# Patient Record
Sex: Female | Born: 1975
Health system: Southern US, Community
[De-identification: ages and names within clinical notes are randomized; demographics above are authoritative.]

## PROBLEM LIST (undated history)

## (undated) DIAGNOSIS — M1990 Unspecified osteoarthritis, unspecified site: Secondary | ICD-10-CM

## (undated) DIAGNOSIS — F319 Bipolar disorder, unspecified: Secondary | ICD-10-CM

## (undated) DIAGNOSIS — IMO0002 Reserved for concepts with insufficient information to code with codable children: Secondary | ICD-10-CM

## (undated) DIAGNOSIS — F32A Depression, unspecified: Secondary | ICD-10-CM

## (undated) DIAGNOSIS — R51 Headache: Secondary | ICD-10-CM

## (undated) DIAGNOSIS — R519 Headache, unspecified: Secondary | ICD-10-CM

## (undated) DIAGNOSIS — F329 Major depressive disorder, single episode, unspecified: Secondary | ICD-10-CM

## (undated) DIAGNOSIS — J45998 Other asthma: Secondary | ICD-10-CM

## (undated) DIAGNOSIS — F419 Anxiety disorder, unspecified: Secondary | ICD-10-CM

## (undated) DIAGNOSIS — F988 Other specified behavioral and emotional disorders with onset usually occurring in childhood and adolescence: Secondary | ICD-10-CM

## (undated) DIAGNOSIS — I499 Cardiac arrhythmia, unspecified: Secondary | ICD-10-CM

## (undated) DIAGNOSIS — M797 Fibromyalgia: Secondary | ICD-10-CM

## (undated) HISTORY — DX: Depression, unspecified: F32.A

## (undated) HISTORY — PX: CARPAL TUNNEL RELEASE: SHX101

## (undated) HISTORY — DX: Other specified behavioral and emotional disorders with onset usually occurring in childhood and adolescence: F98.8

## (undated) HISTORY — DX: Major depressive disorder, single episode, unspecified: F32.9

## (undated) HISTORY — PX: ABLATION: SHX5711

## (undated) HISTORY — PX: WISDOM TOOTH EXTRACTION: SHX21

## (undated) HISTORY — PX: HYSTEROSCOPY W/ ENDOMETRIAL ABLATION: SUR665

## (undated) HISTORY — DX: Anxiety disorder, unspecified: F41.9

## (undated) HISTORY — PX: CHOLECYSTECTOMY: SHX55

## (undated) HISTORY — PX: TUBAL LIGATION: SHX77

## (undated) HISTORY — DX: Bipolar disorder, unspecified: F31.9

---

## 1998-05-22 ENCOUNTER — Emergency Department (HOSPITAL_COMMUNITY): Admission: EM | Admit: 1998-05-22 | Discharge: 1998-05-22 | Payer: Self-pay | Admitting: Emergency Medicine

## 1998-08-26 ENCOUNTER — Emergency Department (HOSPITAL_COMMUNITY): Admission: EM | Admit: 1998-08-26 | Discharge: 1998-08-26 | Payer: Self-pay | Admitting: Emergency Medicine

## 1999-02-11 ENCOUNTER — Other Ambulatory Visit: Admission: RE | Admit: 1999-02-11 | Discharge: 1999-02-11 | Payer: Self-pay | Admitting: Obstetrics and Gynecology

## 1999-04-30 ENCOUNTER — Ambulatory Visit (HOSPITAL_COMMUNITY): Admission: RE | Admit: 1999-04-30 | Discharge: 1999-04-30 | Payer: Self-pay | Admitting: Obstetrics and Gynecology

## 1999-04-30 ENCOUNTER — Encounter: Payer: Self-pay | Admitting: Obstetrics and Gynecology

## 1999-09-24 ENCOUNTER — Encounter (HOSPITAL_COMMUNITY): Admission: RE | Admit: 1999-09-24 | Discharge: 1999-09-27 | Payer: Self-pay | Admitting: Obstetrics and Gynecology

## 1999-09-27 ENCOUNTER — Inpatient Hospital Stay (HOSPITAL_COMMUNITY): Admission: AD | Admit: 1999-09-27 | Discharge: 1999-09-29 | Payer: Self-pay | Admitting: Obstetrics and Gynecology

## 1999-09-27 ENCOUNTER — Encounter (INDEPENDENT_AMBULATORY_CARE_PROVIDER_SITE_OTHER): Payer: Self-pay | Admitting: Specialist

## 1999-10-03 ENCOUNTER — Encounter: Admission: RE | Admit: 1999-10-03 | Discharge: 2000-01-01 | Payer: Self-pay | Admitting: Obstetrics and Gynecology

## 1999-10-13 ENCOUNTER — Emergency Department (HOSPITAL_COMMUNITY): Admission: EM | Admit: 1999-10-13 | Discharge: 1999-10-13 | Payer: Self-pay | Admitting: Emergency Medicine

## 2000-01-02 ENCOUNTER — Encounter: Admission: RE | Admit: 2000-01-02 | Discharge: 2000-04-01 | Payer: Self-pay | Admitting: Obstetrics and Gynecology

## 2000-05-02 ENCOUNTER — Encounter: Admission: RE | Admit: 2000-05-02 | Discharge: 2000-06-01 | Payer: Self-pay | Admitting: Obstetrics and Gynecology

## 2000-06-10 ENCOUNTER — Other Ambulatory Visit: Admission: RE | Admit: 2000-06-10 | Discharge: 2000-06-10 | Payer: Self-pay | Admitting: Obstetrics and Gynecology

## 2000-06-26 ENCOUNTER — Ambulatory Visit (HOSPITAL_COMMUNITY): Admission: RE | Admit: 2000-06-26 | Discharge: 2000-06-26 | Payer: Self-pay | Admitting: Obstetrics and Gynecology

## 2000-07-02 ENCOUNTER — Encounter: Admission: RE | Admit: 2000-07-02 | Discharge: 2000-08-01 | Payer: Self-pay | Admitting: Obstetrics and Gynecology

## 2000-08-31 ENCOUNTER — Other Ambulatory Visit: Admission: RE | Admit: 2000-08-31 | Discharge: 2000-08-31 | Payer: Self-pay | Admitting: Dermatology

## 2000-09-02 ENCOUNTER — Emergency Department (HOSPITAL_COMMUNITY): Admission: EM | Admit: 2000-09-02 | Discharge: 2000-09-02 | Payer: Self-pay | Admitting: Emergency Medicine

## 2000-09-02 ENCOUNTER — Encounter: Payer: Self-pay | Admitting: Emergency Medicine

## 2001-06-11 ENCOUNTER — Other Ambulatory Visit: Admission: RE | Admit: 2001-06-11 | Discharge: 2001-06-11 | Payer: Self-pay | Admitting: Obstetrics and Gynecology

## 2001-06-21 ENCOUNTER — Encounter: Payer: Self-pay | Admitting: Obstetrics and Gynecology

## 2001-06-21 ENCOUNTER — Ambulatory Visit (HOSPITAL_COMMUNITY): Admission: RE | Admit: 2001-06-21 | Discharge: 2001-06-21 | Payer: Self-pay | Admitting: Obstetrics and Gynecology

## 2002-05-13 ENCOUNTER — Emergency Department (HOSPITAL_COMMUNITY): Admission: EM | Admit: 2002-05-13 | Discharge: 2002-05-14 | Payer: Self-pay | Admitting: Emergency Medicine

## 2002-05-15 ENCOUNTER — Encounter: Payer: Self-pay | Admitting: Emergency Medicine

## 2002-05-15 ENCOUNTER — Emergency Department (HOSPITAL_COMMUNITY): Admission: EM | Admit: 2002-05-15 | Discharge: 2002-05-15 | Payer: Self-pay | Admitting: Emergency Medicine

## 2002-07-25 ENCOUNTER — Ambulatory Visit (HOSPITAL_COMMUNITY): Admission: RE | Admit: 2002-07-25 | Discharge: 2002-07-25 | Payer: Self-pay | Admitting: Family Medicine

## 2002-07-25 ENCOUNTER — Encounter: Payer: Self-pay | Admitting: Family Medicine

## 2003-01-16 ENCOUNTER — Ambulatory Visit (HOSPITAL_COMMUNITY): Admission: RE | Admit: 2003-01-16 | Discharge: 2003-01-16 | Payer: Self-pay | Admitting: Family Medicine

## 2003-07-07 ENCOUNTER — Ambulatory Visit (HOSPITAL_COMMUNITY): Admission: RE | Admit: 2003-07-07 | Discharge: 2003-07-07 | Payer: Self-pay | Admitting: Internal Medicine

## 2003-08-08 ENCOUNTER — Emergency Department (HOSPITAL_COMMUNITY): Admission: EM | Admit: 2003-08-08 | Discharge: 2003-08-08 | Payer: Self-pay | Admitting: *Deleted

## 2003-09-06 ENCOUNTER — Ambulatory Visit (HOSPITAL_COMMUNITY): Admission: RE | Admit: 2003-09-06 | Discharge: 2003-09-06 | Payer: Self-pay | Admitting: Family Medicine

## 2004-01-10 ENCOUNTER — Emergency Department (HOSPITAL_COMMUNITY): Admission: EM | Admit: 2004-01-10 | Discharge: 2004-01-11 | Payer: Self-pay | Admitting: Emergency Medicine

## 2004-03-12 ENCOUNTER — Ambulatory Visit (HOSPITAL_COMMUNITY): Admission: RE | Admit: 2004-03-12 | Discharge: 2004-03-12 | Payer: Self-pay | Admitting: Neurosurgery

## 2005-10-31 ENCOUNTER — Other Ambulatory Visit: Admission: RE | Admit: 2005-10-31 | Discharge: 2005-10-31 | Payer: Self-pay | Admitting: Family Medicine

## 2006-02-19 ENCOUNTER — Encounter: Admission: RE | Admit: 2006-02-19 | Discharge: 2006-02-19 | Payer: Self-pay | Admitting: Family Medicine

## 2006-03-07 ENCOUNTER — Emergency Department (HOSPITAL_COMMUNITY): Admission: EM | Admit: 2006-03-07 | Discharge: 2006-03-08 | Payer: Self-pay | Admitting: Emergency Medicine

## 2006-06-28 ENCOUNTER — Ambulatory Visit (HOSPITAL_COMMUNITY): Admission: RE | Admit: 2006-06-28 | Discharge: 2006-06-28 | Payer: Self-pay | Admitting: Family Medicine

## 2006-08-28 ENCOUNTER — Ambulatory Visit (HOSPITAL_COMMUNITY): Admission: RE | Admit: 2006-08-28 | Discharge: 2006-08-28 | Payer: Self-pay | Admitting: Obstetrics and Gynecology

## 2007-02-18 ENCOUNTER — Ambulatory Visit (HOSPITAL_COMMUNITY): Admission: RE | Admit: 2007-02-18 | Discharge: 2007-02-18 | Payer: Self-pay | Admitting: Family Medicine

## 2007-11-24 ENCOUNTER — Ambulatory Visit (HOSPITAL_COMMUNITY): Admission: RE | Admit: 2007-11-24 | Discharge: 2007-11-24 | Payer: Self-pay | Admitting: Otolaryngology

## 2008-08-03 ENCOUNTER — Ambulatory Visit: Payer: Self-pay | Admitting: Diagnostic Radiology

## 2008-08-03 ENCOUNTER — Emergency Department (HOSPITAL_BASED_OUTPATIENT_CLINIC_OR_DEPARTMENT_OTHER): Admission: EM | Admit: 2008-08-03 | Discharge: 2008-08-04 | Payer: Self-pay | Admitting: Emergency Medicine

## 2009-03-29 ENCOUNTER — Encounter: Admission: RE | Admit: 2009-03-29 | Discharge: 2009-03-29 | Payer: Self-pay | Admitting: Family Medicine

## 2009-05-30 ENCOUNTER — Ambulatory Visit (HOSPITAL_BASED_OUTPATIENT_CLINIC_OR_DEPARTMENT_OTHER): Admission: RE | Admit: 2009-05-30 | Discharge: 2009-05-30 | Payer: Self-pay | Admitting: Family Medicine

## 2009-05-30 ENCOUNTER — Ambulatory Visit: Payer: Self-pay | Admitting: Diagnostic Radiology

## 2010-05-24 NOTE — Op Note (Signed)
[facility]  Patient:    Kayla Jacobson, Kayla Jacobson                    MRN: [id number] Proc. Date: 06/26/00 Attending:  Zenaida Niece, M.D.                           Operative Report  PREOPERATIVE DIAGNOSIS:  Desires surgical sterility.  POSTOPERATIVE DIAGNOSIS:  Desires surgical sterility.  PROCEDURE:  Laparoscopic bilateral tubal fulguration.  SURGEON:  Zenaida Niece, M.D.  ANESTHESIA:  General endotracheal tube.  ESTIMATED BLOOD LOSS:  Less than 50 cc.  FINDINGS:  Normal female anatomy.  PROCEDURE IN DETAIL:  The patient was taken to the operating room and placed in the dorsal supine position.  General anesthesia was induced, and she was placed in mobile stirrups.  Her abdomen was then prepped and draped in the usual sterile fashion, her bladder drained with a red rubber catheter, and a Hulka tenaculum applied to her cervix for uterine manipulation.  Her infraumbilical skin was then infiltrated with 0.25% Marcaine, and 1.5 cm horizontal incision was made.  The Veress needle was inserted into the peritoneal cavity and placement confirmed by the water drop test and an opening pressure of 3 mmHg.  Four liters of CO2 gas were insufflated, and the Veress needle was removed.  The 10-11 disposable trocar was then introduced and placement confirmed by the laparoscope.  Both fallopian tubes were identified and traced to their fimbriated ends.  The middle portion of each tube was desiccated with bipolar cautery in three separate segments to achieve tubal fulguration.  This was done on each side until the amp meter read 0 with adequate desiccation.  All sites were hemostatic, and the anatomy was otherwise normal.  All gas was allowed to deflate from the abdomen, and the trocar was removed.  The skin was closed with interrupted subcuticular sutures of 4-0 Vicryl followed by Steri-Strips and band-aids.  The Hulka tenaculum was removed.  Counts were correct.   The patient was extubated in the operating room and tolerated the procedure well and was taken to the recovery room in stable condition. DD:  06/26/00 TD:  06/26/00 Job: [id number]

## 2010-05-24 NOTE — Discharge Summary (Signed)
[facility]  Patient:    Kayla Jacobson, Kayla Jacobson                    MRN: [id number] Proc. Date: 09/29/99 Adm. Date:  [id number] Disc. Date: [id number] Attending:  Michaele Offer                           Discharge Summary  ADMISSION DIAGNOSIS:          Intrauterine pregnancy at 40 weeks.  DISCHARGE DIAGNOSES:          1. Intrauterine pregnancy at 40 weeks,                                  delivered.                               2. Chorioamnionitis.                               3. Obesity.  PROCEDURE:                    Spontaneous vaginal delivery.  COMPLICATIONS:                None.  CONSULTATIONS:                None.  HISTORY AND PHYSICAL:         This is a 35 year old white female gravida 2, para 1-0-0-1 with an EGA of 40+ weeks by first trimester ultrasound with an [facility] of September 24, 1999, who presents with a complaint of rupture of membranes at 12:45 on the day of admission with some irregular contractions, no bleeding and with good fetal movement. Evaluation in the office confirmed spontaneous rupture of membranes and her vaginal examination was 1 to 2, 80, -3, with a vertex presentation.  Prenatal care complicated by an upper respiratory tract infection at 37 weeks treated with Zithromax.  PRENATAL LABS:                Blood type is O positive with a negative antibody screen.  RPR nonreactive.  Rubella immune.  Hepatitis B surface antigen negative.  HIV declined. Triple screen normal.  Gonorrhea and chlamydia negative.  Glucola was 141. A three hour GTT was normal.  Group B strep is negative.  PAST OB HISTORY:              In 1995, vaginal delivery at 40 weeks of 6 pound 7 ounce infant complicated by meconium and postpartum depression treated with Prozac.  GYN HISTORY:                  Significant for an abnormal Pap smear with a normal biopsy and normal follow-up.  PAST MEDICAL HISTORY:         Obesity, depression, history  of PVCs and degenerative disc.  PAST SURGICAL HISTORY:        Wisdom tooth removal.  ALLERGIES:                    PENICILLIN.  SOCIAL HISTORY:               The patient is married and quit smoking with pregnancy.  PHYSICAL EXAMINATION:  VITAL  SIGNS:                  Weight 304 pounds.  She was afebrile.  Blood pressure 130 to 15/70 to 90.  Fetal heart tracing was reactive with contractions every two to three minutes.  ABDOMEN:                      Obese, gravid and nontender with an estimated fetal weight of 8 pounds.  VAGINAL:                      As above in the office and by the nurse on labor and delivery, she was 2, 80 and -3 with possible light meconium stained fluid.  HOSPITAL COURSE:              The patient was admitted and continued to contract on her own.  She had some mild late elevated blood pressures and ______ were drawn and came back within normal limits.  She received an epidural and this brought her blood pressures down and she actually required ephedrine to maintain and adequate blood pressure.  She progressed to 6 cm at this point and the nurse placed an FSE.  She then developed a temperature to 100.6 when she was approximately 8 to 9 cm and received one dose of Cefotan. She then progressed fairly quickly to complete and pushed well.  On the evening of September 27, 1999, she had a vaginal delivery of a viable female infant with Apgars of 8 and 9 that weighed 8 pounds 8 ounces over a first degree laceration and the baby delivered through a body cord and no meconium fluid was seen.  Placenta delivered spontaneous and was intact.  She had a first degree midline laceration and left hymenal laceration repaired with 3-0 Vicryl for hemostasis.  Cervix and rectum were intact and estimated blood loss was less than 500 cc.  Postpartum, the patient did very well.  She breast-fed her baby without complications. She initial temperature postpartum was 100.6 and after  this she remained afebrile without any further antibiotics. Predelivery hemoglobin was 12.3, post delivery 11.4.  On the morning of postpartum day #2 she was stable for discharge home.  CONDITION ON DISCHARGE:       Stable.  DISPOSITION:                  Discharged to home.  DISCHARGE INSTRUCTIONS:       Her diet is a regular diet.  Her activity is pelvic rest.  Follow-up will be in four to six weeks. Medications are Percocet p.r.n. pain and she is given discharge pamphlet.  DD:  09/29/99 TD:  09/30/99 Job: [id number]

## 2010-07-18 ENCOUNTER — Telehealth: Payer: Self-pay | Admitting: Internal Medicine

## 2010-07-18 ENCOUNTER — Encounter: Payer: Self-pay | Admitting: *Deleted

## 2010-07-18 NOTE — Telephone Encounter (Signed)
Patient calling to schedule an appointment. States she has had diarrhea for 3 months. Her PCP did stool studies and it came back negative. States Imodium does stop it but it comes right back. Scheduled OV on 07/31/10 ay 9:30 AM with Dr. Juanda Chance.

## 2010-07-18 NOTE — Telephone Encounter (Signed)
Patient to fax records from PCP.

## 2010-07-31 ENCOUNTER — Encounter: Payer: Self-pay | Admitting: Internal Medicine

## 2010-07-31 ENCOUNTER — Ambulatory Visit: Payer: Self-pay | Admitting: Internal Medicine

## 2015-06-20 ENCOUNTER — Emergency Department (HOSPITAL_BASED_OUTPATIENT_CLINIC_OR_DEPARTMENT_OTHER): Payer: 59

## 2015-06-20 ENCOUNTER — Emergency Department (HOSPITAL_BASED_OUTPATIENT_CLINIC_OR_DEPARTMENT_OTHER)
Admission: EM | Admit: 2015-06-20 | Discharge: 2015-06-20 | Disposition: A | Discharge status: Home / Self Care | Payer: 59 | Attending: Emergency Medicine | Admitting: Emergency Medicine

## 2015-06-20 ENCOUNTER — Encounter (HOSPITAL_BASED_OUTPATIENT_CLINIC_OR_DEPARTMENT_OTHER): Payer: Self-pay

## 2015-06-20 DIAGNOSIS — F329 Major depressive disorder, single episode, unspecified: Secondary | ICD-10-CM | POA: Insufficient documentation

## 2015-06-20 DIAGNOSIS — M79662 Pain in left lower leg: Secondary | ICD-10-CM | POA: Insufficient documentation

## 2015-06-20 DIAGNOSIS — Z87891 Personal history of nicotine dependence: Secondary | ICD-10-CM | POA: Insufficient documentation

## 2015-06-20 DIAGNOSIS — R52 Pain, unspecified: Secondary | ICD-10-CM

## 2015-06-20 DIAGNOSIS — M79605 Pain in left leg: Secondary | ICD-10-CM

## 2015-06-20 DIAGNOSIS — J45909 Unspecified asthma, uncomplicated: Secondary | ICD-10-CM | POA: Insufficient documentation

## 2015-06-20 HISTORY — DX: Other asthma: J45.998

## 2015-06-20 HISTORY — DX: Reserved for concepts with insufficient information to code with codable children: IMO0002

## 2015-06-20 NOTE — ED Provider Notes (Signed)
CSN: [id number]     Arrival date & time 06/20/15  1926 History   First MD Initiated Contact with Patient 06/20/15 2056     Chief Complaint  Patient presents with  . Leg Pain     (Consider location/radiation/quality/duration/timing/severity/associated sxs/prior Treatment) HPI   Pt is a 40 year old female with a history of anxiety, depression, chronic back pain who presents to the ED with progressively worsening left lateral lower leg pain for the past month. Pt states no pain at rest, the pain occurs with walking, is achy, non-radiating, 3/10. Pt has not taken anything for the pain. Pain is on the left lower leg roughly mid shin just lateral to the shin. She states her brother had similar pain and he was diagnosed with a blood clot. She has a strong family history of blood clots. She is concern she has one in her leg. She denies travel, hormone use, recent surgery, chest pain, SOB, fever, chills, abdominal pain, new back pain.   Past Medical History  Diagnosis Date  . Depression   . Anxiety   . Chiari malformation   . Seasonal asthma    Past Surgical History  Procedure Laterality Date  . Tubal ligation    . Wisdom tooth extraction    . Cholecystectomy    . Carpal tunnel release      bilateral  . Ablation     Family History  Problem Relation Age of Onset  . Uterine cancer Mother   . Breast cancer      grandmother  . Diabetes Father    Social History  Substance Use Topics  . Smoking status: Former Smoker    Types: E-cigarettes  . Smokeless tobacco: None  . Alcohol Use: Yes     Comment: occasional   OB History    No data available     Review of Systems  Constitutional: Negative for fever and chills.  HENT: Negative for trouble swallowing.   Eyes: Negative for visual disturbance.  Respiratory: Negative for chest tightness and shortness of breath.   Cardiovascular: Negative for chest pain and leg swelling.  Gastrointestinal: Negative for nausea, vomiting and abdominal  pain.  Musculoskeletal: Positive for myalgias. Negative for joint swelling and arthralgias.  Skin: Negative for rash.  Neurological: Negative for dizziness, syncope, weakness, numbness and headaches.  Psychiatric/Behavioral: Negative for confusion.      Allergies  Advair diskus  Home Medications   Prior to Admission medications   Not on File   BP 128/62 mmHg  Pulse 68  Temp(Src) 98.7 F (37.1 C) (Oral)  Resp 20  Ht 5\' 8"  (1.727 m)  Wt 113.399 kg  BMI 38.02 kg/m2  SpO2 98%  LMP 06/17/2015 Physical Exam  Constitutional: She appears well-developed and well-nourished. No distress.  HENT:  Head: Normocephalic and atraumatic.  Eyes: Conjunctivae are normal.  Neck: Normal range of motion.  Cardiovascular: Normal rate, regular rhythm and normal heart sounds.  Exam reveals no gallop and no friction rub.   No murmur heard. Pulses:      Dorsalis pedis pulses are 2+ on the right side, and 2+ on the left side.  Pulmonary/Chest: Effort normal and breath sounds normal. No respiratory distress. She has no wheezes. She has no rales.  Musculoskeletal: Normal range of motion.  Examination of the bilateral lower extremities revealed no deformities, edema, ecchymosis, full AROM, strength 5/5, mild TTP to the lateral aspect of mid shin, neurovascularly intact distally.  Neurological: She is alert. Coordination normal.  Skin:  Skin is warm and dry. She is not diaphoretic.  Psychiatric: She has a normal mood and affect. Her behavior is normal.  Nursing note and vitals reviewed.   ED Course  Procedures (including critical care time) Labs Review Labs Reviewed - No data to display  Imaging Review US Venous Img Lower Unilateral Left  06/20/2015  CLINICAL DATA:  Pain in the left shin for 1 month. Family history of blood clots. EXAM: Left LOWER EXTREMITY VENOUS DOPPLER ULTRASOUND TECHNIQUE: Gray-scale sonography with graded compression, as well as color Doppler and duplex ultrasound were  performed to evaluate the lower extremity deep venous systems from the level of the common femoral vein and including the common femoral, femoral, profunda femoral, popliteal and calf veins including the posterior tibial, peroneal and gastrocnemius veins when visible. The superficial great saphenous vein was also interrogated. Spectral Doppler was utilized to evaluate flow at rest and with distal augmentation maneuvers in the common femoral, femoral and popliteal veins. COMPARISON:  None. FINDINGS: Contralateral Common Femoral Vein: Respiratory phasicity is normal and symmetric with the symptomatic side. No evidence of thrombus. Normal compressibility. Common Femoral Vein: No evidence of thrombus. Normal compressibility, respiratory phasicity and response to augmentation. Saphenofemoral Junction: No evidence of thrombus. Normal compressibility and flow on color Doppler imaging. Profunda Femoral Vein: No evidence of thrombus. Normal compressibility and flow on color Doppler imaging. Femoral Vein: No evidence of thrombus. Normal compressibility, respiratory phasicity and response to augmentation. Popliteal Vein: No evidence of thrombus. Normal compressibility, respiratory phasicity and response to augmentation. Calf Veins: No evidence of thrombus. Normal compressibility and flow on color Doppler imaging. Superficial Great Saphenous Vein: No evidence of thrombus. Normal compressibility and flow on color Doppler imaging. Venous Reflux:  None. Other Findings:  None. IMPRESSION: No evidence of deep venous thrombosis. Electronically Signed   By: Lucienne Capers M.D.   On: 06/20/2015 22:53   I have personally reviewed and evaluated these images and lab results as part of my medical decision-making.   EKG Interpretation None      MDM   Final diagnoses:  Pain  Left leg pain   Patient with left lower leg pain. Patient's concerned that she may have a DVT due to strong family history of blood clots. She states  her brother had a DVT and his pain was similar to hers. Patient without swelling or redness to left lower leg. Patient without history of travel, hormone use, recent surgery, history of blood clots. Patient without chest pain, shortness of breath, dizziness less ikely PE. Patient requested testing to confirm this. ultrasound revealed no evidence of DVT. I instructed patient to follow-up with her primary care provider and have testing done to confirm whether or not she has a familial blood clotting disorder. Patient states she had a physical a few weeks ago which included a CBC and a CMP. She declined these tests at this time. She states she'll follow with her primary care provider within one week. I discussed strict return precautions with the patient to include signs of DVT or PE. Patient expressed understanding to the discharge instructions.    Kalman Drape, [place] 06/21/15 0150  Charlesetta Shanks, MD 06/24/15 [phone]

## 2015-06-20 NOTE — ED Notes (Signed)
Patient transported to X-ray 

## 2015-06-20 NOTE — Discharge Instructions (Signed)
Follow-up with your primary care provider in 2 days to be reevaluated for your leg pain.  Return to the emergency department if you experience chest pain, shortness of breath, or back pain, dizziness, coughing up pink sputum, or you pass out.

## 2015-06-20 NOTE — ED Notes (Signed)
Pa  at bedside. 

## 2015-06-20 NOTE — ED Notes (Signed)
Pt c/o left lower leg pain for the last three weeks, worsening over the last two days.  No swelling present, no warmth or redness, pt is tender to the touch on the front of the leg, not the calf.  Pt denies any long trips, no birth control, has e-cigarette habit and sedentary occupation

## 2015-08-10 ENCOUNTER — Other Ambulatory Visit (HOSPITAL_COMMUNITY): Payer: Self-pay | Admitting: Family Medicine

## 2015-08-10 DIAGNOSIS — R200 Anesthesia of skin: Secondary | ICD-10-CM

## 2015-08-27 ENCOUNTER — Ambulatory Visit (HOSPITAL_COMMUNITY): Payer: 59

## 2015-09-04 ENCOUNTER — Other Ambulatory Visit (HOSPITAL_COMMUNITY): Payer: Self-pay | Admitting: Family Medicine

## 2015-09-04 DIAGNOSIS — R200 Anesthesia of skin: Secondary | ICD-10-CM

## 2015-09-05 ENCOUNTER — Ambulatory Visit (HOSPITAL_COMMUNITY)
Admission: RE | Admit: 2015-09-05 | Discharge: 2015-09-05 | Disposition: A | Discharge status: Home / Self Care | Payer: 59 | Source: Ambulatory Visit | Attending: Family Medicine | Admitting: Family Medicine

## 2015-09-05 ENCOUNTER — Ambulatory Visit (HOSPITAL_COMMUNITY): Admission: RE | Admit: 2015-09-05 | Payer: 59 | Source: Ambulatory Visit

## 2015-09-05 DIAGNOSIS — R200 Anesthesia of skin: Secondary | ICD-10-CM | POA: Insufficient documentation

## 2015-09-05 DIAGNOSIS — M5136 Other intervertebral disc degeneration, lumbar region: Secondary | ICD-10-CM | POA: Insufficient documentation

## 2015-09-05 DIAGNOSIS — G935 Compression of brain: Secondary | ICD-10-CM | POA: Insufficient documentation

## 2015-09-05 DIAGNOSIS — M5126 Other intervertebral disc displacement, lumbar region: Secondary | ICD-10-CM | POA: Insufficient documentation

## 2015-09-06 ENCOUNTER — Ambulatory Visit (HOSPITAL_COMMUNITY): Admission: RE | Admit: 2015-09-06 | Payer: 59 | Source: Ambulatory Visit

## 2015-10-16 NOTE — Progress Notes (Signed)
Surgery on 11/21/15.  Need orders in EPIc.  Thank You.

## 2015-11-09 ENCOUNTER — Encounter (HOSPITAL_COMMUNITY): Payer: Self-pay

## 2015-11-13 ENCOUNTER — Encounter (HOSPITAL_COMMUNITY): Payer: Self-pay

## 2015-11-13 ENCOUNTER — Encounter (HOSPITAL_COMMUNITY): Payer: 59

## 2015-11-14 ENCOUNTER — Encounter (HOSPITAL_COMMUNITY): Payer: Self-pay

## 2015-11-14 ENCOUNTER — Encounter (HOSPITAL_COMMUNITY)
Admission: RE | Admit: 2015-11-14 | Discharge: 2015-11-14 | Disposition: A | Discharge status: Home / Self Care | Payer: 59 | Source: Ambulatory Visit | Attending: Orthopedic Surgery | Admitting: Orthopedic Surgery

## 2015-11-14 ENCOUNTER — Ambulatory Visit (HOSPITAL_COMMUNITY)
Admission: RE | Admit: 2015-11-14 | Discharge: 2015-11-14 | Disposition: A | Discharge status: Home / Self Care | Payer: 59 | Source: Ambulatory Visit | Attending: Surgical | Admitting: Surgical

## 2015-11-14 DIAGNOSIS — Z01810 Encounter for preprocedural cardiovascular examination: Secondary | ICD-10-CM | POA: Diagnosis present

## 2015-11-14 DIAGNOSIS — M545 Low back pain, unspecified: Secondary | ICD-10-CM

## 2015-11-14 DIAGNOSIS — Z01812 Encounter for preprocedural laboratory examination: Secondary | ICD-10-CM | POA: Insufficient documentation

## 2015-11-14 DIAGNOSIS — Z01818 Encounter for other preprocedural examination: Secondary | ICD-10-CM | POA: Diagnosis present

## 2015-11-14 DIAGNOSIS — I499 Cardiac arrhythmia, unspecified: Secondary | ICD-10-CM | POA: Insufficient documentation

## 2015-11-14 DIAGNOSIS — M47816 Spondylosis without myelopathy or radiculopathy, lumbar region: Secondary | ICD-10-CM | POA: Diagnosis not present

## 2015-11-14 HISTORY — DX: Headache: R51

## 2015-11-14 HISTORY — DX: Fibromyalgia: M79.7

## 2015-11-14 HISTORY — DX: Unspecified osteoarthritis, unspecified site: M19.90

## 2015-11-14 HISTORY — DX: Cardiac arrhythmia, unspecified: I49.9

## 2015-11-14 HISTORY — DX: Headache, unspecified: R51.9

## 2015-11-14 LAB — CBC WITH DIFFERENTIAL/PLATELET
Basophils Absolute: 0 10*3/uL (ref 0.0–0.1)
Basophils Relative: 0 %
Eosinophils Absolute: 0.2 10*3/uL (ref 0.0–0.7)
Eosinophils Relative: 3 %
HCT: 36.2 % (ref 36.0–46.0)
Hemoglobin: 12.3 g/dL (ref 12.0–15.0)
Lymphocytes Relative: 26 %
Lymphs Abs: 1.5 10*3/uL (ref 0.7–4.0)
MCH: 31.1 pg (ref 26.0–34.0)
MCHC: 34 g/dL (ref 30.0–36.0)
MCV: 91.6 fL (ref 78.0–100.0)
Monocytes Absolute: 0.3 10*3/uL (ref 0.1–1.0)
Monocytes Relative: 6 %
Neutro Abs: 3.6 10*3/uL (ref 1.7–7.7)
Neutrophils Relative %: 65 %
Platelets: 226 10*3/uL (ref 150–400)
RBC: 3.95 MIL/uL (ref 3.87–5.11)
RDW: 13 % (ref 11.5–15.5)
WBC: 5.5 10*3/uL (ref 4.0–10.5)

## 2015-11-14 LAB — COMPREHENSIVE METABOLIC PANEL
ALT: 16 U/L (ref 14–54)
AST: 21 U/L (ref 15–41)
Albumin: 3.7 g/dL (ref 3.5–5.0)
Alkaline Phosphatase: 71 U/L (ref 38–126)
Anion gap: 6 (ref 5–15)
BUN: 10 mg/dL (ref 6–20)
CO2: 27 mmol/L (ref 22–32)
Calcium: 8.8 mg/dL — ABNORMAL LOW (ref 8.9–10.3)
Chloride: 109 mmol/L (ref 101–111)
Creatinine, Ser: 0.67 mg/dL (ref 0.44–1.00)
GFR calc Af Amer: >60 mL/min (ref >60)
GFR calc non Af Amer: >60 mL/min (ref >60)
Glucose, Bld: 108 mg/dL — ABNORMAL HIGH (ref 65–99)
Potassium: 3.5 mmol/L (ref 3.5–5.1)
Sodium: 142 mmol/L (ref 135–145)
Total Bilirubin: 0.3 mg/dL (ref 0.3–1.2)
Total Protein: 6.5 g/dL (ref 6.5–8.1)

## 2015-11-14 LAB — ABO/RH: ABO/RH(D): O POS

## 2015-11-14 LAB — SURGICAL PCR SCREEN
MRSA, PCR: NEGATIVE
STAPHYLOCOCCUS AUREUS: NEGATIVE

## 2015-11-14 LAB — PROTIME-INR
INR: 0.95
Prothrombin Time: 12.6 seconds (ref 11.4–15.2)

## 2015-11-14 LAB — APTT: aPTT: 29 seconds (ref 24–36)

## 2015-11-14 LAB — HCG, SERUM, QUALITATIVE: Preg, Serum: NEGATIVE

## 2015-11-14 NOTE — Patient Instructions (Signed)
DESIRIE BOSSCHER  11/14/2015   Your procedure is scheduled on: 11/21/15  Report to [facility] Main  Entrance take [place]  elevators to 3rd floor to  [place] at [place]  AM.  Call this number if you have problems the morning of surgery [phone]   Remember: ONLY 1 PERSON MAY GO WITH YOU TO SHORT STAY TO GET  READY MORNING OF [place].  Do not eat food or drink liquids :After Midnight.     Take these medicines the morning of surgery with A SIP OF WATER: Lexapro, Lamictal, Oxycodone May take Alprazolam if needed                                You may not have any metal on your body including hair pins and              piercings  Do not wear jewelry, make-up, lotions, powders or perfumes, deodorant             Do not wear nail polish.  Do not shave  48 hours prior to surgery.              Men may shave face and neck.   Do not bring valuables to the hospital. [place].  Contacts, dentures or bridgework may not be worn into surgery.  Leave suitcase in the car. After surgery it may be brought to your room.                Please read over the following fact sheets you were given: _____________________________________________________________________             [facility] - Preparing for Surgery Before surgery, you can play an important role.  Because skin is not sterile, your skin needs to be as free of germs as possible.  You can reduce the number of germs on your skin by washing with CHG (chlorahexidine gluconate) soap before surgery.  CHG is an antiseptic cleaner which kills germs and bonds with the skin to continue killing germs even after washing. Please DO NOT use if you have an allergy to CHG or antibacterial soaps.  If your skin becomes reddened/irritated stop using the CHG and inform your nurse when you arrive at Short Stay. Do not shave (including legs and underarms) for at least 48 hours  prior to the first CHG shower.  You may shave your face/neck. Please follow these instructions carefully:  1.  Shower with CHG Soap the night before surgery and the  morning of Surgery.  2.  If you choose to wash your hair, wash your hair first as usual with your  normal  shampoo.  3.  After you shampoo, rinse your hair and body thoroughly to remove the  shampoo.                           4.  Use CHG as you would any other liquid soap.  You can apply chg directly  to the skin and wash                       Gently with a scrungie or clean washcloth.  5.  Apply the CHG Soap to your body ONLY FROM THE NECK DOWN.   Do not use on face/ open                           Wound or open sores. Avoid contact with eyes, ears mouth and genitals (private parts).                       Wash face,  Genitals (private parts) with your normal soap.             6.  Wash thoroughly, paying special attention to the area where your surgery  will be performed.  7.  Thoroughly rinse your body with warm water from the neck down.  8.  DO NOT shower/wash with your normal soap after using and rinsing off  the CHG Soap.                9.  Pat yourself dry with a clean towel.            10.  Wear clean pajamas.            11.  Place clean sheets on your bed the night of your first shower and do not  sleep with pets. Day of Surgery : Do not apply any lotions/deodorants the morning of surgery.  Please wear clean clothes to the hospital/surgery center.  FAILURE TO FOLLOW THESE INSTRUCTIONS MAY RESULT IN THE CANCELLATION OF YOUR SURGERY PATIENT SIGNATURE_________________________________  NURSE SIGNATURE__________________________________  ________________________________________________________________________   Adam Phenix  An incentive spirometer is a tool that can help keep your lungs clear and active. This tool measures how well you are filling your lungs with each breath. Taking long deep breaths may help reverse  or decrease the chance of developing breathing (pulmonary) problems (especially infection) following:  A long period of time when you are unable to move or be active. BEFORE THE PROCEDURE   If the spirometer includes an indicator to show your best effort, your nurse or respiratory therapist will set it to a desired goal.  If possible, sit up straight or lean slightly forward. Try not to slouch.  Hold the incentive spirometer in an upright position. INSTRUCTIONS FOR USE  1. Sit on the edge of your bed if possible, or sit up as far as you can in bed or on a chair. 2. Hold the incentive spirometer in an upright position. 3. Breathe out normally. 4. Place the mouthpiece in your mouth and seal your lips tightly around it. 5. Breathe in slowly and as deeply as possible, raising the piston or the ball toward the top of the column. 6. Hold your breath for 3-5 seconds or for as long as possible. Allow the piston or ball to fall to the bottom of the column. 7. Remove the mouthpiece from your mouth and breathe out normally. 8. Rest for a few seconds and repeat Steps 1 through 7 at least 10 times every 1-2 hours when you are awake. Take your time and take a few normal breaths between deep breaths. 9. The spirometer may include an indicator to show your best effort. Use the indicator as a goal to work toward during each repetition. 10. After each set of 10 deep breaths, practice coughing to be sure your lungs are clear. If you have an incision (the cut made at the time of surgery), support your incision when coughing by placing a  pillow or rolled up towels firmly against it. Once you are able to get out of bed, walk around indoors and cough well. You may stop using the incentive spirometer when instructed by your caregiver.  RISKS AND COMPLICATIONS  Take your time so you do not get dizzy or light-headed.  If you are in pain, you may need to take or ask for pain medication before doing incentive  spirometry. It is harder to take a deep breath if you are having pain. AFTER USE  Rest and breathe slowly and easily.  It can be helpful to keep track of a log of your progress. Your caregiver can provide you with a simple table to help with this. If you are using the spirometer at home, follow these instructions: [place] IF:   You are having difficultly using the spirometer.  You have trouble using the spirometer as often as instructed.  Your pain medication is not giving enough relief while using the spirometer.  You develop fever of 100.5 F (38.1 C) or higher. SEEK IMMEDIATE MEDICAL CARE IF:   You cough up bloody sputum that had not been present before.  You develop fever of 102 F (38.9 C) or greater.  You develop worsening pain at or near the incision site. MAKE SURE YOU:   Understand these instructions.  Will watch your condition.  Will get help right away if you are not doing well or get worse. Document Released: 05/05/2006 Document Revised: 03/17/2011 Document Reviewed: 07/06/2006 Exit[employer] Patient Information 2014 Exit[employer], [place].   ________________________________________________________________________  WHAT IS A BLOOD TRANSFUSION? Blood Transfusion Information  A transfusion is the replacement of blood or some of its parts. Blood is made up of multiple cells which provide different functions.  Red blood cells carry oxygen and are used for blood loss replacement.  White blood cells fight against infection.  Platelets control bleeding.  Plasma helps clot blood.  Other blood products are available for specialized needs, such as hemophilia or other clotting disorders. BEFORE THE TRANSFUSION  Who gives blood for transfusions?   Healthy volunteers who are fully evaluated to make sure their blood is safe. This is blood bank blood. Transfusion therapy is the safest it has ever been in the practice of medicine. Before blood is taken from a donor, a  complete history is taken to make sure that person has no history of diseases nor engages in risky social behavior (examples are intravenous drug use or sexual activity with multiple partners). The donor's travel history is screened to minimize risk of transmitting infections, such as malaria. The donated blood is tested for signs of infectious diseases, such as HIV and hepatitis. The blood is then tested to be sure it is compatible with you in order to minimize the chance of a transfusion reaction. If you or a relative donates blood, this is often done in anticipation of surgery and is not appropriate for emergency situations. It takes many days to process the donated blood. RISKS AND COMPLICATIONS Although transfusion therapy is very safe and saves many lives, the main dangers of transfusion include:   Getting an infectious disease.  Developing a transfusion reaction. This is an allergic reaction to something in the blood you were given. Every precaution is taken to prevent this. The decision to have a blood transfusion has been considered carefully by your caregiver before blood is given. Blood is not given unless the benefits outweigh the risks. AFTER THE TRANSFUSION  Right after receiving a blood transfusion, you  will usually feel much better and more energetic. This is especially true if your red blood cells have gotten low (anemic). The transfusion raises the level of the red blood cells which carry oxygen, and this usually causes an energy increase.  The nurse administering the transfusion will monitor you carefully for complications. HOME CARE INSTRUCTIONS  No special instructions are needed after a transfusion. You may find your energy is better. Speak with your caregiver about any limitations on activity for underlying diseases you may have. SEEK MEDICAL CARE IF:   Your condition is not improving after your transfusion.  You develop redness or irritation at the intravenous (IV)  site. SEEK IMMEDIATE MEDICAL CARE IF:  Any of the following symptoms occur over the next 12 hours:  Shaking chills.  You have a temperature by mouth above 102 F (38.9 C), not controlled by medicine.  Chest, back, or muscle pain.  People around you feel you are not acting correctly or are confused.  Shortness of breath or difficulty breathing.  Dizziness and fainting.  You get a rash or develop hives.  You have a decrease in urine output.  Your urine turns a dark color or changes to pink, red, or brown. Any of the following symptoms occur over the next 10 days:  You have a temperature by mouth above 102 F (38.9 C), not controlled by medicine.  Shortness of breath.  Weakness after normal activity.  The white part of the eye turns yellow (jaundice).  You have a decrease in the amount of urine or are urinating less often.  Your urine turns a dark color or changes to pink, red, or brown. Document Released: 12/21/1999 Document Revised: 03/17/2011 Document Reviewed: 08/09/2007 [facility] Patient Information 2014 [place], [place].  _______________________________________________________________________

## 2015-11-15 ENCOUNTER — Other Ambulatory Visit (HOSPITAL_COMMUNITY): Payer: 59

## 2015-11-15 ENCOUNTER — Encounter (HOSPITAL_COMMUNITY): Payer: Self-pay

## 2015-11-15 NOTE — Progress Notes (Signed)
Spoke with pt by phone patient aware surgery time changed to 1100, arrive 900 am 11-21-15 [facility] short stay

## 2015-11-20 MED ORDER — DEXTROSE 5 % IV SOLN
3.0000 g | INTRAVENOUS | Status: AC
  Administered 2015-11-21: 3 g via INTRAVENOUS
  Filled 2015-11-20: qty 3

## 2015-11-21 ENCOUNTER — Ambulatory Visit (HOSPITAL_COMMUNITY): Payer: 59

## 2015-11-21 ENCOUNTER — Encounter (HOSPITAL_COMMUNITY): Payer: Self-pay | Admitting: *Deleted

## 2015-11-21 ENCOUNTER — Observation Stay (HOSPITAL_COMMUNITY)
Admission: RE | Admit: 2015-11-21 | Discharge: 2015-11-22 | Disposition: A | Discharge status: Home / Self Care | Payer: 59 | Source: Ambulatory Visit | Attending: Orthopedic Surgery | Admitting: Orthopedic Surgery

## 2015-11-21 ENCOUNTER — Encounter (HOSPITAL_COMMUNITY)
Admission: RE | Disposition: A | Discharge status: Home / Self Care | Payer: Self-pay | Source: Ambulatory Visit | Attending: Orthopedic Surgery

## 2015-11-21 ENCOUNTER — Ambulatory Visit (HOSPITAL_COMMUNITY): Payer: 59 | Admitting: Certified Registered"

## 2015-11-21 DIAGNOSIS — G43909 Migraine, unspecified, not intractable, without status migrainosus: Secondary | ICD-10-CM | POA: Diagnosis not present

## 2015-11-21 DIAGNOSIS — M5126 Other intervertebral disc displacement, lumbar region: Secondary | ICD-10-CM | POA: Diagnosis present

## 2015-11-21 DIAGNOSIS — Z6839 Body mass index (BMI) 39.0-39.9, adult: Secondary | ICD-10-CM | POA: Diagnosis not present

## 2015-11-21 DIAGNOSIS — M549 Dorsalgia, unspecified: Secondary | ICD-10-CM | POA: Insufficient documentation

## 2015-11-21 DIAGNOSIS — Z8614 Personal history of Methicillin resistant Staphylococcus aureus infection: Secondary | ICD-10-CM | POA: Insufficient documentation

## 2015-11-21 DIAGNOSIS — Z87891 Personal history of nicotine dependence: Secondary | ICD-10-CM | POA: Insufficient documentation

## 2015-11-21 DIAGNOSIS — J45909 Unspecified asthma, uncomplicated: Secondary | ICD-10-CM | POA: Diagnosis not present

## 2015-11-21 DIAGNOSIS — F329 Major depressive disorder, single episode, unspecified: Secondary | ICD-10-CM | POA: Diagnosis not present

## 2015-11-21 DIAGNOSIS — Z8049 Family history of malignant neoplasm of other genital organs: Secondary | ICD-10-CM | POA: Insufficient documentation

## 2015-11-21 DIAGNOSIS — G8929 Other chronic pain: Secondary | ICD-10-CM | POA: Insufficient documentation

## 2015-11-21 DIAGNOSIS — Z888 Allergy status to other drugs, medicaments and biological substances status: Secondary | ICD-10-CM | POA: Insufficient documentation

## 2015-11-21 DIAGNOSIS — M48061 Spinal stenosis, lumbar region without neurogenic claudication: Secondary | ICD-10-CM | POA: Diagnosis not present

## 2015-11-21 DIAGNOSIS — F419 Anxiety disorder, unspecified: Secondary | ICD-10-CM | POA: Diagnosis not present

## 2015-11-21 DIAGNOSIS — Z419 Encounter for procedure for purposes other than remedying health state, unspecified: Secondary | ICD-10-CM

## 2015-11-21 DIAGNOSIS — Z90710 Acquired absence of both cervix and uterus: Secondary | ICD-10-CM | POA: Insufficient documentation

## 2015-11-21 DIAGNOSIS — Z833 Family history of diabetes mellitus: Secondary | ICD-10-CM | POA: Diagnosis not present

## 2015-11-21 DIAGNOSIS — M21372 Foot drop, left foot: Secondary | ICD-10-CM | POA: Insufficient documentation

## 2015-11-21 DIAGNOSIS — I499 Cardiac arrhythmia, unspecified: Secondary | ICD-10-CM | POA: Insufficient documentation

## 2015-11-21 DIAGNOSIS — M1990 Unspecified osteoarthritis, unspecified site: Secondary | ICD-10-CM | POA: Diagnosis not present

## 2015-11-21 DIAGNOSIS — M797 Fibromyalgia: Secondary | ICD-10-CM | POA: Insufficient documentation

## 2015-11-21 DIAGNOSIS — Z803 Family history of malignant neoplasm of breast: Secondary | ICD-10-CM | POA: Diagnosis not present

## 2015-11-21 DIAGNOSIS — Z79899 Other long term (current) drug therapy: Secondary | ICD-10-CM | POA: Diagnosis not present

## 2015-11-21 HISTORY — PX: LUMBAR LAMINECTOMY/DECOMPRESSION MICRODISCECTOMY: SHX5026

## 2015-11-21 LAB — TYPE AND SCREEN
ABO/RH(D): O POS
Antibody Screen: NEGATIVE

## 2015-11-21 SURGERY — LUMBAR LAMINECTOMY/DECOMPRESSION MICRODISCECTOMY
Anesthesia: General | Site: Back | Laterality: Left

## 2015-11-21 MED ORDER — HYDROMORPHONE HCL 1 MG/ML IJ SOLN
INTRAMUSCULAR | Status: AC
Start: 2015-11-21 — End: 2015-11-21
  Administered 2015-11-21: 0.5 mg via INTRAVENOUS
  Filled 2015-11-21: qty 1

## 2015-11-21 MED ORDER — CEFAZOLIN IN D5W 1 GM/50ML IV SOLN
1.0000 g | Freq: Three times a day (TID) | INTRAVENOUS | Status: DC
  Administered 2015-11-21 – 2015-11-22 (×2): 1 g via INTRAVENOUS
  Filled 2015-11-21 (×3): qty 50

## 2015-11-21 MED ORDER — ALPRAZOLAM 1 MG PO TABS
1.0000 mg | ORAL_TABLET | Freq: Two times a day (BID) | ORAL | Status: DC | PRN
  Administered 2015-11-21 – 2015-11-22 (×2): 1 mg via ORAL
  Filled 2015-11-21 (×2): qty 1

## 2015-11-21 MED ORDER — HYDROMORPHONE HCL 1 MG/ML IJ SOLN
0.2500 mg | INTRAMUSCULAR | Status: DC | PRN
Start: 2015-11-21 — End: 2015-11-21
  Administered 2015-11-21 (×5): 0.5 mg via INTRAVENOUS

## 2015-11-21 MED ORDER — BACITRACIN ZINC 500 UNIT/GM EX OINT
TOPICAL_OINTMENT | CUTANEOUS | Status: AC
  Filled 2015-11-21: qty 28.35

## 2015-11-21 MED ORDER — BUPIVACAINE LIPOSOME 1.3 % IJ SUSP
INTRAMUSCULAR | Status: DC | PRN
  Administered 2015-11-21: 20 mL

## 2015-11-21 MED ORDER — MEPERIDINE HCL 50 MG/ML IJ SOLN: 6.2500 mg | INTRAMUSCULAR | Status: DC | PRN

## 2015-11-21 MED ORDER — SUCCINYLCHOLINE CHLORIDE 20 MG/ML IJ SOLN
INTRAMUSCULAR | Status: AC
  Filled 2015-11-21: qty 1

## 2015-11-21 MED ORDER — ESCITALOPRAM OXALATE 10 MG PO TABS
10.0000 mg | ORAL_TABLET | Freq: Every day | ORAL | Status: DC
  Administered 2015-11-22: 10 mg via ORAL
  Filled 2015-11-21: qty 1

## 2015-11-21 MED ORDER — PROPOFOL 10 MG/ML IV BOLUS
INTRAVENOUS | Status: DC | PRN
  Administered 2015-11-21: 200 mg via INTRAVENOUS

## 2015-11-21 MED ORDER — OXYCODONE-ACETAMINOPHEN 5-325 MG PO TABS: 2.0000 | ORAL_TABLET | ORAL | Status: DC | PRN

## 2015-11-21 MED ORDER — HYDROCODONE-ACETAMINOPHEN 5-325 MG PO TABS: 1.0000 | ORAL_TABLET | ORAL | Status: DC | PRN

## 2015-11-21 MED ORDER — MIDAZOLAM HCL 2 MG/2ML IJ SOLN
0.5000 mg | Freq: Once | INTRAMUSCULAR | Status: AC | PRN
  Administered 2015-11-21: 0.5 mg via INTRAVENOUS

## 2015-11-21 MED ORDER — SODIUM CHLORIDE 0.9 % IR SOLN
Status: AC
  Filled 2015-11-21: qty 500000

## 2015-11-21 MED ORDER — MIDAZOLAM HCL 2 MG/2ML IJ SOLN
INTRAMUSCULAR | Status: AC
  Administered 2015-11-21: 0.5 mg via INTRAVENOUS
  Filled 2015-11-21: qty 2

## 2015-11-21 MED ORDER — AMPHETAMINE-DEXTROAMPHETAMINE 20 MG PO TABS: 20.0000 mg | ORAL_TABLET | Freq: Every day | ORAL | Status: DC

## 2015-11-21 MED ORDER — MENTHOL 3 MG MT LOZG
1.0000 | LOZENGE | OROMUCOSAL | Status: DC | PRN
Start: 2015-11-21 — End: 2015-11-22

## 2015-11-21 MED ORDER — TRIAMTERENE-HCTZ 37.5-25 MG PO TABS
1.0000 | ORAL_TABLET | Freq: Every day | ORAL | Status: DC
  Filled 2015-11-21 (×2): qty 1

## 2015-11-21 MED ORDER — LIDOCAINE-EPINEPHRINE 1 %-1:100000 IJ SOLN
INTRAMUSCULAR | Status: DC | PRN
  Administered 2015-11-21: 20 mL

## 2015-11-21 MED ORDER — PHENOL 1.4 % MT LIQD
1.0000 | OROMUCOSAL | Status: DC | PRN
Start: 2015-11-21 — End: 2015-11-22

## 2015-11-21 MED ORDER — METHOCARBAMOL 1000 MG/10ML IJ SOLN
500.0000 mg | Freq: Four times a day (QID) | INTRAVENOUS | Status: DC | PRN
  Administered 2015-11-21: 500 mg via INTRAVENOUS
  Filled 2015-11-21: qty 5
  Filled 2015-11-21: qty 550

## 2015-11-21 MED ORDER — BUPIVACAINE HCL (PF) 0.5 % IJ SOLN
INTRAMUSCULAR | Status: AC
  Filled 2015-11-21: qty 30

## 2015-11-21 MED ORDER — ROCURONIUM BROMIDE 50 MG/5ML IV SOSY
PREFILLED_SYRINGE | INTRAVENOUS | Status: AC
  Filled 2015-11-21: qty 5

## 2015-11-21 MED ORDER — ONDANSETRON HCL 4 MG/2ML IJ SOLN
INTRAMUSCULAR | Status: DC | PRN
  Administered 2015-11-21: 4 mg via INTRAVENOUS

## 2015-11-21 MED ORDER — BUPIVACAINE LIPOSOME 1.3 % IJ SUSP
20.0000 mL | Freq: Once | INTRAMUSCULAR | Status: DC
  Filled 2015-11-21: qty 20

## 2015-11-21 MED ORDER — DEXAMETHASONE SODIUM PHOSPHATE 10 MG/ML IJ SOLN
INTRAMUSCULAR | Status: AC
  Filled 2015-11-21: qty 1

## 2015-11-21 MED ORDER — LIDOCAINE 2% (20 MG/ML) 5 ML SYRINGE
INTRAMUSCULAR | Status: DC | PRN
  Administered 2015-11-21: 100 mg via INTRAVENOUS

## 2015-11-21 MED ORDER — SCOPOLAMINE 1 MG/3DAYS TD PT72
1.0000 | MEDICATED_PATCH | Freq: Once | TRANSDERMAL | Status: DC
  Administered 2015-11-21: 1.5 mg via TRANSDERMAL

## 2015-11-21 MED ORDER — BACITRACIN-NEOMYCIN-POLYMYXIN 400-5-5000 EX OINT
TOPICAL_OINTMENT | CUTANEOUS | Status: AC
  Filled 2015-11-21: qty 1

## 2015-11-21 MED ORDER — HYDROMORPHONE HCL 1 MG/ML IJ SOLN
INTRAMUSCULAR | Status: AC
  Administered 2015-11-21: 0.5 mg via INTRAVENOUS
  Filled 2015-11-21: qty 1

## 2015-11-21 MED ORDER — LACTATED RINGERS IV SOLN
INTRAVENOUS | Status: DC
  Administered 2015-11-21 (×2): via INTRAVENOUS

## 2015-11-21 MED ORDER — ONDANSETRON HCL 4 MG/2ML IJ SOLN
INTRAMUSCULAR | Status: AC
  Filled 2015-11-21: qty 2

## 2015-11-21 MED ORDER — SUGAMMADEX SODIUM 500 MG/5ML IV SOLN
INTRAVENOUS | Status: DC | PRN
  Administered 2015-11-21: 240 mg via INTRAVENOUS

## 2015-11-21 MED ORDER — FENTANYL CITRATE (PF) 100 MCG/2ML IJ SOLN
INTRAMUSCULAR | Status: DC | PRN
  Administered 2015-11-21 (×2): 50 ug via INTRAVENOUS
  Administered 2015-11-21: 100 ug via INTRAVENOUS

## 2015-11-21 MED ORDER — MIDAZOLAM HCL 5 MG/5ML IJ SOLN
INTRAMUSCULAR | Status: DC | PRN
  Administered 2015-11-21: 2 mg via INTRAVENOUS

## 2015-11-21 MED ORDER — LISDEXAMFETAMINE DIMESYLATE 70 MG PO CAPS: 70.0000 mg | ORAL_CAPSULE | Freq: Every day | ORAL | Status: DC

## 2015-11-21 MED ORDER — PROPOFOL 10 MG/ML IV BOLUS
INTRAVENOUS | Status: AC
  Filled 2015-11-21: qty 20

## 2015-11-21 MED ORDER — SCOPOLAMINE 1 MG/3DAYS TD PT72
MEDICATED_PATCH | TRANSDERMAL | Status: AC
  Filled 2015-11-21: qty 1

## 2015-11-21 MED ORDER — LIDOCAINE 2% (20 MG/ML) 5 ML SYRINGE
INTRAMUSCULAR | Status: AC
  Filled 2015-11-21: qty 5

## 2015-11-21 MED ORDER — HYDROMORPHONE HCL 1 MG/ML IJ SOLN
0.5000 mg | INTRAMUSCULAR | Status: DC | PRN
  Administered 2015-11-21 – 2015-11-22 (×4): 1 mg via INTRAVENOUS
  Filled 2015-11-21 (×4): qty 1

## 2015-11-21 MED ORDER — LACTATED RINGERS IV SOLN
INTRAVENOUS | Status: DC
  Administered 2015-11-21: 100 mL/h via INTRAVENOUS
  Administered 2015-11-22: 02:00:00 via INTRAVENOUS

## 2015-11-21 MED ORDER — LIDOCAINE-EPINEPHRINE 1 %-1:100000 IJ SOLN
INTRAMUSCULAR | Status: AC
  Filled 2015-11-21: qty 1

## 2015-11-21 MED ORDER — DEXAMETHASONE SODIUM PHOSPHATE 10 MG/ML IJ SOLN
INTRAMUSCULAR | Status: DC | PRN
  Administered 2015-11-21: 10 mg via INTRAVENOUS

## 2015-11-21 MED ORDER — FENTANYL CITRATE (PF) 100 MCG/2ML IJ SOLN
INTRAMUSCULAR | Status: AC
  Filled 2015-11-21: qty 2

## 2015-11-21 MED ORDER — MIDAZOLAM HCL 2 MG/2ML IJ SOLN
INTRAMUSCULAR | Status: AC
  Filled 2015-11-21: qty 2

## 2015-11-21 MED ORDER — PROMETHAZINE HCL 25 MG/ML IJ SOLN: 6.2500 mg | INTRAMUSCULAR | Status: DC | PRN

## 2015-11-21 MED ORDER — ONDANSETRON HCL 4 MG/2ML IJ SOLN: 4.0000 mg | INTRAMUSCULAR | Status: DC | PRN

## 2015-11-21 MED ORDER — HEMOSTATIC AGENTS (NO CHARGE) OPTIME
TOPICAL | Status: DC | PRN
  Administered 2015-11-21: 1 via TOPICAL

## 2015-11-21 MED ORDER — HYDROMORPHONE HCL 1 MG/ML IJ SOLN
0.2500 mg | INTRAMUSCULAR | Status: DC | PRN
  Administered 2015-11-21: 0.5 mg via INTRAVENOUS

## 2015-11-21 MED ORDER — ROCURONIUM BROMIDE 10 MG/ML (PF) SYRINGE
PREFILLED_SYRINGE | INTRAVENOUS | Status: DC | PRN
  Administered 2015-11-21: 35 mg via INTRAVENOUS
  Administered 2015-11-21: 20 mg via INTRAVENOUS
  Administered 2015-11-21 (×2): 10 mg via INTRAVENOUS
  Administered 2015-11-21: 5 mg via INTRAVENOUS

## 2015-11-21 MED ORDER — BISACODYL 5 MG PO TBEC: 5.0000 mg | DELAYED_RELEASE_TABLET | Freq: Every day | ORAL | Status: DC | PRN

## 2015-11-21 MED ORDER — OXYCODONE-ACETAMINOPHEN 5-325 MG PO TABS: 1.0000 | ORAL_TABLET | ORAL | Status: DC | PRN

## 2015-11-21 MED ORDER — BACITRACIN-NEOMYCIN-POLYMYXIN 400-5-5000 EX OINT
TOPICAL_OINTMENT | CUTANEOUS | Status: DC | PRN
  Administered 2015-11-21: 1 via TOPICAL

## 2015-11-21 MED ORDER — SUGAMMADEX SODIUM 500 MG/5ML IV SOLN
INTRAVENOUS | Status: AC
  Filled 2015-11-21: qty 5

## 2015-11-21 MED ORDER — SODIUM CHLORIDE 0.9 % IR SOLN
Status: DC | PRN
  Administered 2015-11-21: 500 mL

## 2015-11-21 MED ORDER — OXYCODONE HCL 5 MG PO TABS
5.0000 mg | ORAL_TABLET | ORAL | Status: DC | PRN
  Administered 2015-11-21: 15 mg via ORAL
  Administered 2015-11-21: 10 mg via ORAL
  Administered 2015-11-21: 5 mg via ORAL
  Administered 2015-11-21: 10 mg via ORAL
  Administered 2015-11-22 (×4): 15 mg via ORAL
  Filled 2015-11-21: qty 1
  Filled 2015-11-21 (×3): qty 3
  Filled 2015-11-21 (×2): qty 2
  Filled 2015-11-21 (×2): qty 3

## 2015-11-21 MED ORDER — POLYETHYLENE GLYCOL 3350 17 G PO PACK: 17.0000 g | PACK | Freq: Every day | ORAL | Status: DC | PRN

## 2015-11-21 MED ORDER — METHOCARBAMOL 500 MG PO TABS
500.0000 mg | ORAL_TABLET | Freq: Four times a day (QID) | ORAL | Status: DC | PRN
  Administered 2015-11-21 – 2015-11-22 (×3): 500 mg via ORAL
  Filled 2015-11-21 (×3): qty 1

## 2015-11-21 SURGICAL SUPPLY — 51 items
AGENT HMST SPONGE THK3/8 (HEMOSTASIS) ×1
BAG SPEC THK2 15X12 ZIP CLS (MISCELLANEOUS) ×1
BAG ZIPLOCK 12X15 (MISCELLANEOUS) ×3 IMPLANT
CLEANER TIP ELECTROSURG 2X2 (MISCELLANEOUS) ×3 IMPLANT
DRAIN PENROSE 18X1/4 LTX STRL (WOUND CARE) IMPLANT
DRAPE MICROSCOPE LEICA (MISCELLANEOUS) ×3 IMPLANT
DRAPE POUCH INSTRU U-SHP 10X18 (DRAPES) ×3 IMPLANT
DRAPE SHEET LG 3/4 BI-LAMINATE (DRAPES) ×3 IMPLANT
DRAPE SURG 17X11 SM STRL (DRAPES) ×3 IMPLANT
DRSG ADAPTIC 3X8 NADH LF (GAUZE/BANDAGES/DRESSINGS) ×3 IMPLANT
DURAPREP 26ML APPLICATOR (WOUND CARE) ×3 IMPLANT
ELECT BLADE TIP CTD 4 INCH (ELECTRODE) ×3 IMPLANT
ELECT REM PT RETURN 9FT ADLT (ELECTROSURGICAL) ×3
ELECTRODE REM PT RTRN 9FT ADLT (ELECTROSURGICAL) ×1 IMPLANT
GAUZE SPONGE 4X4 12PLY STRL (GAUZE/BANDAGES/DRESSINGS) ×3 IMPLANT
GLOVE BIOGEL PI IND STRL 6.5 (GLOVE) ×2 IMPLANT
GLOVE BIOGEL PI IND STRL 7.5 (GLOVE) ×3 IMPLANT
GLOVE BIOGEL PI IND STRL 8 (GLOVE) ×1 IMPLANT
GLOVE BIOGEL PI INDICATOR 6.5 (GLOVE) ×4
GLOVE BIOGEL PI INDICATOR 7.5 (GLOVE) ×6
GLOVE BIOGEL PI INDICATOR 8 (GLOVE) ×2
GLOVE ECLIPSE 8.0 STRL XLNG CF (GLOVE) ×6 IMPLANT
GLOVE SURG SS PI 6.5 STRL IVOR (GLOVE) ×6 IMPLANT
GLOVE SURG SS PI 7.5 STRL IVOR (GLOVE) ×3 IMPLANT
GOWN STRL REUS W/ TWL XL LVL3 (GOWN DISPOSABLE) ×1 IMPLANT
GOWN STRL REUS W/TWL LRG LVL3 (GOWN DISPOSABLE) ×6 IMPLANT
GOWN STRL REUS W/TWL XL LVL3 (GOWN DISPOSABLE) ×6 IMPLANT
HEMOSTAT SPONGE AVITENE ULTRA (HEMOSTASIS) ×3 IMPLANT
KIT BASIN OR (CUSTOM PROCEDURE TRAY) ×3 IMPLANT
KIT POSITIONING SURG ANDREWS (MISCELLANEOUS) ×3 IMPLANT
MANIFOLD NEPTUNE II (INSTRUMENTS) ×3 IMPLANT
MARKER SKIN DUAL TIP RULER LAB (MISCELLANEOUS) ×3 IMPLANT
NEEDLE HYPO 22GX1.5 SAFETY (NEEDLE) ×6 IMPLANT
NEEDLE SPNL 18GX3.5 QUINCKE PK (NEEDLE) ×6 IMPLANT
PACK LAMINECTOMY ORTHO (CUSTOM PROCEDURE TRAY) ×3 IMPLANT
PAD ABD 8X10 STRL (GAUZE/BANDAGES/DRESSINGS) ×9 IMPLANT
PATTIES SURGICAL .5 X.5 (GAUZE/BANDAGES/DRESSINGS) IMPLANT
PATTIES SURGICAL .75X.75 (GAUZE/BANDAGES/DRESSINGS) ×3 IMPLANT
PATTIES SURGICAL 1X1 (DISPOSABLE) ×3 IMPLANT
PIN SAFETY NICK PLATE  2 MED (MISCELLANEOUS)
PIN SAFETY NICK PLATE 2 MED (MISCELLANEOUS) IMPLANT
RUBBERBAND STERILE (MISCELLANEOUS) ×3 IMPLANT
SPONGE LAP 4X18 X RAY DECT (DISPOSABLE) ×9 IMPLANT
STAPLER VISISTAT 35W (STAPLE) ×3 IMPLANT
SUT VIC AB 0 CT1 27 (SUTURE) ×3
SUT VIC AB 0 CT1 27XBRD ANTBC (SUTURE) ×1 IMPLANT
SUT VIC AB 1 CT1 27 (SUTURE) ×6
SUT VIC AB 1 CT1 27XBRD ANTBC (SUTURE) ×3 IMPLANT
SYR 20CC LL (SYRINGE) ×6 IMPLANT
TAPE CLOTH SURG 6X10 WHT LF (GAUZE/BANDAGES/DRESSINGS) ×3 IMPLANT
TOWEL OR 17X26 10 PK STRL BLUE (TOWEL DISPOSABLE) ×3 IMPLANT

## 2015-11-21 NOTE — Brief Op Note (Signed)
11/21/2015  2:13 PM  PATIENT:  Kayla Jacobson  40 y.o. female  PRE-OPERATIVE DIAGNOSIS:  HERNIATED DISC ON LEFT L4-5and Spinal Stenosis  POST-OPERATIVE DIAGNOSIS:  HERNIATED DISC ON LEFT L4-5 and Lateral recess stenosis and Foraminal Stenosis involving the L-4 and L-5 nerve roots.  PROCEDURE:  Procedure(s): CENTRAL LUMBAR DECOMPRESSION MICRODISCECTOMY L4-L5, MICRODISECTOMY L4-L5 LEFT; FORAMINOTOMY L4 AND L5 ROOT LEFT (Left).Decompressive Lumbar Laminectomy at L-4-KL-5 for Spinal Stenosis.  SURGEON:  Surgeon(s) and Role:    * Latanya Maudlin, MD - Primary  PHYSICIAN ASSISTANT:Amber [place] P[place] PA   ASSISTANTS: Ardeen Jourdain PA  ANESTHESIA:   general  EBL:  Total I/O In: 1300 [I.V.:1300] Out: 200 [Urine:100; Blood:100]  BLOOD ADMINISTERED:none  DRAINS: none   LOCAL MEDICATIONS USED:  Xylocaine 20cc of 1% with Epinephrine at start of the case and Exparel 2occ at the end of the case.    SPECIMEN:  No Specimen  DISPOSITION OF SPECIMEN:  N/A  COUNTS:  YES  TOURNIQUET:  * No tourniquets in log *  DICTATION: .Other Dictation: Dictation Number [id number]  PLAN OF CARE: Admit for overnight observation  PATIENT DISPOSITION:  PACU - hemodynamically stable.   Delay start of Pharmacological VTE agent (>24hrs) due to surgical blood loss or risk of bleeding: yes

## 2015-11-21 NOTE — Anesthesia Postprocedure Evaluation (Signed)
Anesthesia Post Note  Patient: Kayla Jacobson  Procedure(s) Performed: Procedure(s) (LRB): CENTRAL LUMBAR DECOMPRESSION MICRODISCECTOMY L4-L5, MICRODISECTOMY L4-L5 LEFT; FORAMINOTOMY L4 AND L5 ROOT LEFT (Left)  Patient location during evaluation: PACU Anesthesia Type: General Level of consciousness: awake and alert, oriented and patient cooperative Pain management: pain level controlled Vital Signs Assessment: post-procedure vital signs reviewed and stable Respiratory status: spontaneous breathing, nonlabored ventilation, respiratory function stable and patient connected to nasal cannula oxygen Cardiovascular status: blood pressure returned to baseline and stable Postop Assessment: no signs of nausea or vomiting Anesthetic complications: no    Last Vitals:  Vitals:   11/21/15 1500 11/21/15 1513  BP: (!) 100/47 (!) 93/43  Pulse: 61 60  Resp: 12 15  Temp:  36.8 C    Last Pain:  Vitals:   11/21/15 1513  TempSrc: Oral  PainSc: 6                  Alazay Leicht,E. Karl Knarr

## 2015-11-21 NOTE — Interval H&P Note (Signed)
History and Physical Interval Note:  11/21/2015 10:52 AM  Kayla Jacobson  has presented today for surgery, with the diagnosis of HERNIATED DISC ON LEFT L4-5  The various methods of treatment have been discussed with the patient and family. After consideration of risks, benefits and other options for treatment, the patient has consented to  Procedure(s): LUMBAR LAMINECTOMY/DECOMPRESSION MICRODISCECTOMY L4-5 LEFT (Left) as a surgical intervention .  The patient's history has been reviewed, patient examined, no change in status, stable for surgery.  I have reviewed the patient's chart and labs.  Questions were answered to the patient's satisfaction.     Ginger Leeth A

## 2015-11-21 NOTE — Interval H&P Note (Signed)
History and Physical Interval Note:  11/21/2015 10:48 AM  Kayla Jacobson  has presented today for surgery, with the diagnosis of HERNIATED DISC ON LEFT L4-5  The various methods of treatment have been discussed with the patient and family. After consideration of risks, benefits and other options for treatment, the patient has consented to  Procedure(s): LUMBAR LAMINECTOMY/DECOMPRESSION MICRODISCECTOMY L4-5 LEFT (Left) as a surgical intervention .  The patient's history has been reviewed, patient examined, no change in status, stable for surgery.  I have reviewed the patient's chart and labs.  Questions were answered to the patient's satisfaction.     Davin Archuletta A

## 2015-11-21 NOTE — Transfer of Care (Signed)
Immediate Anesthesia Transfer of Care Note  Patient: Kayla Jacobson  Procedure(s) Performed: Procedure(s): LUMBAR LAMINECTOMY/DECOMPRESSION MICRODISCECTOMY L4-5 LEFT (Left)  Patient Location: PACU  Anesthesia Type:General  Level of Consciousness: awake, alert  and oriented  Airway & Oxygen Therapy: Patient Spontanous Breathing and Patient connected to face mask oxygen  Post-op Assessment: Report given to RN and Post -op Vital signs reviewed and stable  Post vital signs: Reviewed and stable  Last Vitals:  Vitals:   11/21/15 0854  BP: 129/70  Pulse: 65  Resp: 18  Temp: 36.9 C    Last Pain:  Vitals:   11/21/15 0916  TempSrc:   PainSc: 6       Patients Stated Pain Goal: 4 ([id number])  Complications: No apparent anesthesia complications

## 2015-11-21 NOTE — Anesthesia Preprocedure Evaluation (Addendum)
Anesthesia Evaluation  Patient identified by MRN, date of birth, ID band Patient awake    Reviewed: Allergy & Precautions, NPO status , Patient's Chart, lab work & pertinent test results  History of Anesthesia Complications Negative for: history of anesthetic complications  Airway Mallampati: I  TM Distance: >3 FB Neck ROM: Full    Dental  (+) Dental Advisory Given   Pulmonary asthma , former smoker,    breath sounds clear to auscultation       Cardiovascular hypertension, Pt. on medications (-) angina Rhythm:Regular Rate:Normal     Neuro/Psych  Headaches, Anxiety Depression Chronic back pain: narcotics    GI/Hepatic negative GI ROS, Neg liver ROS,   Endo/Other  Morbid obesity  Renal/GU negative Renal ROS     Musculoskeletal  (+) Arthritis ,   Abdominal (+) + obese,   Peds  Hematology negative hematology ROS (+)   Anesthesia Other Findings   Reproductive/Obstetrics S/p BTL                            Anesthesia Physical Anesthesia Plan  ASA: II  Anesthesia Plan:    Post-op Pain Management:    Induction: Intravenous  Airway Management Planned: Oral ETT  Additional Equipment:   Intra-op Plan:   Post-operative Plan: Extubation in OR  Informed Consent: I have reviewed the patients History and Physical, chart, labs and discussed the procedure including the risks, benefits and alternatives for the proposed anesthesia with the patient or authorized representative who has indicated his/her understanding and acceptance.   Dental advisory given  Plan Discussed with: CRNA and Surgeon  Anesthesia Plan Comments: (Plan routine monitors, GETA)        Anesthesia Quick Evaluation

## 2015-11-21 NOTE — H&P (Signed)
Kayla Jacobson is an 40 y.o. female.   Chief Complaint: Pain in her left leg with weakness of her left foot. HPI: She has complained of progressive pain in her left leg and weakness in her left foot when ambulating.  Past Medical History:  Diagnosis Date  . Anxiety   . Arthritis   . Chiari malformation   . Depression    bipolar  . Dysrhythmia   . Fibromyalgia   . Headache    migraines  . Seasonal asthma     Past Surgical History:  Procedure Laterality Date  . ABLATION    . CARPAL TUNNEL RELEASE     bilateral  . CHOLECYSTECTOMY    . HYSTEROSCOPY W/ ENDOMETRIAL ABLATION    . TUBAL LIGATION    . WISDOM TOOTH EXTRACTION      Family History  Problem Relation Age of Onset  . Uterine cancer Mother   . Breast cancer      grandmother  . Diabetes Father    Social History:  reports that she has quit smoking. Her smoking use included E-cigarettes. She has never used smokeless tobacco. She reports that she drinks alcohol. Her drug history is not on file.  Allergies:  Allergies  Allergen Reactions  . Advair Diskus [Fluticasone-Salmeterol] Hives  . Other Other (See Comments)    WALNUTS---THROAT CLOSES UP    Medications Prior to Admission  Medication Sig Dispense Refill  . ALPRAZolam (XANAX) 1 MG tablet Take 1 mg by mouth 2 (two) times daily as needed for anxiety.    Marland Kitchen amphetamine-dextroamphetamine (ADDERALL) 20 MG tablet Take 20 mg by mouth 2 (two) times daily as needed.    . butalbital-acetaminophen-caffeine (FIORICET, ESGIC) 50-325-40 MG tablet Take 1 tablet by mouth 2 (two) times daily as needed for headache.    . carisoprodol (SOMA) 350 MG tablet Take 350 mg by mouth 2 (two) times daily as needed for muscle spasms.    Marland Kitchen escitalopram (LEXAPRO) 10 MG tablet Take 10 mg by mouth every morning.    . lamoTRIgine (LAMICTAL) 150 MG tablet Take 150 mg by mouth 2 (two) times daily.    Marland Kitchen lisdexamfetamine (VYVANSE) 70 MG capsule Take 70 mg by mouth daily.    . Menthol, Topical  Analgesic, (BIOFREEZE) 4 % GEL Apply 1 application topically as needed.    . Oxycodone HCl 20 MG TABS Take 1 tablet by mouth 2 (two) times daily as needed.    . triamterene-hydrochlorothiazide (MAXZIDE-25) 37.5-25 MG tablet Take 1 tablet by mouth daily.      No results found for this or any previous visit (from the past 48 hour(s)). No results found.  Review of Systems  Constitutional: Negative.   HENT: Negative.   Eyes: Negative.   Respiratory: Negative.   Cardiovascular: Negative.   Gastrointestinal: Negative.   Genitourinary: Negative.   Musculoskeletal: Positive for back pain.  Skin: Negative.   Neurological: Positive for focal weakness.  Endo/Heme/Allergies: Negative.   Psychiatric/Behavioral: Negative.     Blood pressure 129/70, pulse 65, temperature 98.5 F (36.9 C), temperature source Oral, resp. rate 18, height 5\' 7"  (1.702 m), weight 114.8 kg (253 lb), last menstrual period 11/09/2015, SpO2 98 %. Physical Exam  Constitutional: She appears well-developed.  HENT:  Head: Normocephalic.  Eyes: Pupils are equal, round, and reactive to light.  Neck: Normal range of motion.  Cardiovascular: Normal rate.   Respiratory: Effort normal.  GI: Soft.  Musculoskeletal: She exhibits tenderness.  Neurological:  Weaknes of her Left foot dorsiflexors.positive  Straight leg raising on the left.  Skin: Skin is warm.  Psychiatric: She has a normal mood and affect.     Assessment/Plan Decompressive lumbar Laminectomy and Microdiscectomy at L-4-L-5 on the left.  Tobi Bastos, MD 11/21/2015, 10:42 AM

## 2015-11-21 NOTE — Anesthesia Procedure Notes (Signed)
Procedure Name: Intubation Date/Time: 11/21/2015 11:31 AM Performed by: Noralyn Pick D Pre-anesthesia Checklist: Patient identified, Emergency Drugs available, Suction available and Patient being monitored Patient Re-evaluated:Patient Re-evaluated prior to inductionOxygen Delivery Method: Circle system utilized Preoxygenation: Pre-oxygenation with 100% oxygen Intubation Type: IV induction Ventilation: Mask ventilation without difficulty Laryngoscope Size: Mac and 4 Tube type: Oral Tube size: 7.5 mm Number of attempts: 1 Airway Equipment and Method: Stylet Placement Confirmation: ETT inserted through vocal cords under direct vision,  positive ETCO2 and breath sounds checked- equal and bilateral Secured at: 21 cm Tube secured with: Tape Dental Injury: Teeth and Oropharynx as per pre-operative assessment

## 2015-11-22 DIAGNOSIS — M5126 Other intervertebral disc displacement, lumbar region: Secondary | ICD-10-CM | POA: Diagnosis not present

## 2015-11-22 MED ORDER — OXYCODONE HCL 5 MG PO TABS: 5.0000 mg | ORAL_TABLET | ORAL | 0 refills | Status: DC | PRN

## 2015-11-22 MED ORDER — ASPIRIN EC 325 MG PO TBEC: 325.0000 mg | DELAYED_RELEASE_TABLET | Freq: Every day | ORAL | 0 refills | Status: DC

## 2015-11-22 MED ORDER — SODIUM CHLORIDE 0.9 % IV BOLUS (SEPSIS)
500.0000 mL | Freq: Once | INTRAVENOUS | Status: AC
  Administered 2015-11-22: 500 mL via INTRAVENOUS

## 2015-11-22 NOTE — Discharge Instructions (Addendum)
For the first three days, remove your dressing, tape a piece of saran wrap over your incision. Take your shower, then remove the saran wrap and put a clean dressing on. After three days you can shower without the saran wrap.  Do not take your blood pressure medication if your blood pressure is less than 130/90 Call Dr. Gladstone Lighter if any wound complications or temperature of 101 degrees F or over.  Call the office for an appointment to see Dr. Gladstone Lighter in two weeks: [phone] and ask for Dr. Charlestine Night nurse, Brunilda Payor.

## 2015-11-22 NOTE — Op Note (Addendum)
NAMEMarland Kitchen  Kayla Jacobson, Kayla Jacobson           ACCOUNT NO.:  [id number]  MEDICAL RECORD NO.:  [id number]  LOCATION:  [id number]                         FACILITY:  [facility]  PHYSICIAN:  Kipp Brood. Sherard Sutch, M.D.DATE OF BIRTH:  1975/08/26  DATE OF PROCEDURE:  11/21/2015 DATE OF DISCHARGE:                              OPERATIVE REPORT   SURGEON:  Kipp Brood. Gladstone Lighter, M.D.  ASSISTANT:  Ardeen Jourdain, [place].  PREOPERATIVE DIAGNOSES:Morbid Obesity 1. Herniated lumbar disk at L4-5 on the left. 2. Partial footdrop on the left. 3. Foraminal Jacobson for the L4 root on the left. 4. Foraminal Jacobson for the L5 root on the left. 5. Spinal Jacobson at L4-5. 6.Morbid Obesity POSTOPERATIVE DIAGNOSES: 1. Herniated lumbar disk at L4-5 on the left. 2. Partial footdrop on the left. 3. Foraminal Jacobson for the L4 root on the left. 4. Foraminal Jacobson for the L5 root on the left. 5. Spinal Jacobson at L4-5. 6.Morbid Obesity OPERATIONS: 1. Decompressive lumbar laminectomy at L4-5, spinal Jacobson. 2. Microdiskectomy at L4-5 on the left. 3. Foraminotomy for the L4 root on the left. 4. Foraminotomy for the L5 root on the left.  SPECIMEN:  No specimen was sent.  DESCRIPTION OF PROCEDURE:  Under general anesthesia, routine orthopedic prep and draping of the lower back was carried out with the patient on a spinal frame.  Appropriate time-out was first carried out.  The patient had 3 g of IV Ancef.  At this particular time, two needles were placed in the back for localization purposes.  X-ray was taken.  Following that, an incision was made over the L4-5 space and extended proximally and distally.  Self-retaining retractors were inserted after we obtained good hemostasis.  I then went down, identified the lumbodorsal fascia. An incision was made over the L4-5 area and extended proximally and distally.  The muscle then was separated from the lamina and spinous process bilaterally.  Another x-ray was taken with the  instrument in place.  At that time, the [facility] retractors then were inserted.  We had a nice identification of this spinous process of L4.  I then went down and carried out a central decompressive lumbar laminectomy at this time.  Great care was taken not to injure the underlying dura.  Note, at this particular time, also I brought the microscope in and we removed the ligamentum flavum and protected the dura.  I gently went out laterally, decompressed the lateral recess.  Kayla Jacobson.  I went up, decompressed the foramen for the L4 root and distally for the 5 root.  Once we well identified the 5 root, instruments were placed in the area again for localization purposes and make sure we were at L4-5.  At this particular point, I cauterized the lateral recess veins as I gently retracted the root.  I then put a spinal needle into the disk to make sure we had the disk area located well and we did.  I made a cruciate incision in the disk space and then did a microdiskectomy.  Of note, the space was quite narrow.  I utilized the nerve hooks and the Epstein curettes.  I went out laterally in the foramen and decompressed the disk as well.  I then made sure there were no loose fragments, there were none.  We enough made sure we had total freedom now the root above and below.  We did the dura now was totally free, thoroughly irrigated out the area and loosely applied some thrombin-soaked Gelfoam, closed the wound layers in usual fashion except I left a small distal and proximal deep part of the wound open for drainage purposes.  Subcu was closed with 2-0 Vicryl, the remaining part of the skin was closed with staples.  Of note, at the beginning of the case, I injected 20 mL of 1% Xylocaine with epinephrine.  At the end of the case, I injected 20 mL of Exparel into the soft tissue for pain relief.  Sterile dressings were applied.  The patient left the operating room in  satisfactory condition.          ______________________________ Kipp Brood Gladstone Lighter, M.D.     RAG/MEDQ  D:  11/21/2015  T:  11/22/2015  Job:  [id number]

## 2015-11-22 NOTE — Evaluation (Signed)
Physical Therapy Evaluation Patient Details Name: Kayla Jacobson MRN: [id number] DOB: Dec 17, 1975 Today's Date: 11/22/2015   History of Present Illness  Pt had CENTRAL LUMBAR DECOMPRESSION MICRODISCECTOMY L4-L5, MICRODISECTOMY L4-L5 LEFT; FORAMINOTOMY L4 AND L5 ROOT LEFT   Clinical Impression  Pt admitted as above and presenting with functional mobility limitations 2* post op pain and back precautions.  Pt currently mobilizing at min guard/sup level and with good support at home.  Pt with good awareness of back precautions and planning dc home this date.    Follow Up Recommendations No PT follow up    Equipment Recommendations  Rolling walker with 5" wheels    Recommendations for Other Services OT consult     Precautions / Restrictions Precautions Precautions: Back Precaution Booklet Issued: Yes (comment) Restrictions Weight Bearing Restrictions: No      Mobility  Bed Mobility Overal bed mobility: Needs Assistance Bed Mobility: Sit to Sidelying   Sidelying to sit: Min assist     Sit to sidelying: Min guard General bed mobility comments: Spouse assisting  Transfers Overall transfer level: Needs assistance Equipment used: Rolling walker (2 wheeled) Transfers: Sit to/from Stand Sit to Stand: Min guard Stand pivot transfers: Min guard       General transfer comment: VC for safety and back precautions  Ambulation/Gait Ambulation/Gait assistance: Min guard;Supervision Ambulation Distance (Feet): 400 Feet Assistive device: Rolling walker (2 wheeled) Gait Pattern/deviations: Step-through pattern;Decreased step length - right;Decreased step length - left;Shuffle;Trunk flexed Gait velocity: decr Gait velocity interpretation: Below normal speed for age/gender General Gait Details: cues for posture and position from RW  Stairs Stairs:  (Pt declines to attempt but describes process)          Wheelchair Mobility    Modified Rankin (Stroke Patients Only)        Balance                                             Pertinent Vitals/Pain Pain Assessment: 0-10 Pain Score: 4  Pain Location: back Pain Descriptors / Indicators: Aching;Sore Pain Intervention(s): Limited activity within patient's tolerance;Monitored during session;Premedicated before session;Patient requesting pain meds-RN notified;Ice applied    Home Living Family/patient expects to be discharged to:: Private residence Living Arrangements: Spouse/significant other Available Help at Discharge: Family Type of Home: House Home Access: Stairs to enter Entrance Stairs-Rails: Right Entrance Stairs-Number of Steps: 3 Home Layout: One level Home Equipment: None      Prior Function Level of Independence: Independent               Hand Dominance        Extremity/Trunk Assessment   Upper Extremity Assessment: Overall WFL for tasks assessed           Lower Extremity Assessment: Overall WFL for tasks assessed         Communication   Communication: No difficulties  Cognition Arousal/Alertness: Awake/alert Behavior During Therapy: WFL for tasks assessed/performed Overall Cognitive Status: Within Functional Limits for tasks assessed                      General Comments      Exercises     Assessment/Plan    PT Assessment Patient needs continued PT services  PT Problem List Decreased activity tolerance;Decreased mobility;Decreased knowledge of use of DME;Pain          PT Treatment  Interventions DME instruction;Gait training;Stair training;Functional mobility training;Therapeutic activities;Patient/family education    PT Goals (Current goals can be found in the Care Plan section)  Acute Rehab PT Goals Patient Stated Goal: less pain PT Goal Formulation: All assessment and education complete, DC therapy    Frequency Min 1X/week   Barriers to discharge        Co-evaluation               End of Session    Activity Tolerance: Patient tolerated treatment well Patient left: in bed;with call bell/phone within reach;with family/visitor present Nurse Communication: Mobility status    Functional Assessment Tool Used: Clinical judgement Functional Limitation: Mobility: Walking and moving around Mobility: Walking and Moving Around Current Status [id number]): At least 1 percent but less than 20 percent impaired, limited or restricted Mobility: Walking and Moving Around Goal Status [phone]): At least 1 percent but less than 20 percent impaired, limited or restricted Mobility: Walking and Moving Around Discharge Status [phone]): At least 1 percent but less than 20 percent impaired, limited or restricted    Time: 1050-1115 PT Time Calculation (min) (ACUTE ONLY): 25 min   Charges:   PT Evaluation $PT Eval Low Complexity: 1 Procedure     PT G Codes:   PT G-Codes **NOT FOR INPATIENT CLASS** Functional Assessment Tool Used: Clinical judgement Functional Limitation: Mobility: Walking and moving around Mobility: Walking and Moving Around Current Status [id number]): At least 1 percent but less than 20 percent impaired, limited or restricted Mobility: Walking and Moving Around Goal Status [phone]): At least 1 percent but less than 20 percent impaired, limited or restricted Mobility: Walking and Moving Around Discharge Status [phone]): At least 1 percent but less than 20 percent impaired, limited or restricted    Kayla Jacobson 11/22/2015, 12:27 PM

## 2015-11-22 NOTE — Progress Notes (Signed)
Discharged from floor via w/c for transport home by car. Spouse & belongings with pt. No changes in assessment. Kayla Jacobson  

## 2015-11-22 NOTE — Care Management Note (Signed)
Case Management Note  Patient Details  Name: TOMIA ENLOW MRN: [id number] Date of Birth: 02/15/1975  Subjective/Objective:                  CENTRAL LUMBAR DECOMPRESSION MICRODISCECTOMY L4-L5, MICRODISECTOMY L4-L5 LEFT; FORAMINOTOMY L4 AND L5 ROOT LEFT  Action/Plan: Discharge planning Expected Discharge Date:  11/22/15               Expected Discharge Plan:  Home/Self Care  In-House Referral:     Discharge planning Services  CM Consult  Post Acute Care Choice:  NA Choice offered to:  Patient  DME Arranged:  3-N-1, Walker rolling DME Agency:  [place]:  NA [place] Agency:  NA  Status of Service:  Completed, signed off  If discussed at [place] of Stay Meetings, dates discussed:    Additional Comments: CM met with pt who needs 3n1 and rolling walker.  CM notified [facility] DME rep, Reggie to please deliver the DME to room prior to discharge.  NO other CM needs were communicated. Dellie Catholic, RN 11/22/2015, 12:04 PM

## 2015-11-22 NOTE — Progress Notes (Signed)
Subjective: 1 Day Post-Op Procedure(s) (LRB): CENTRAL LUMBAR DECOMPRESSION MICRODISCECTOMY L4-L5, MICRODISECTOMY L4-L5 LEFT; FORAMINOTOMY L4 AND L5 ROOT LEFT (Left) Patient reports pain as 3 on 0-10 scale. Doing much better this morning. Good strength in her left foot. Will ambulate with PT and then DC.   Objective: Vital signs in last 24 hours: Temp:  [97.7 F (36.5 C)-99.6 F (37.6 C)] 97.7 F (36.5 C) (11/16 0156) Pulse Rate:  [52-110] 76 (11/16 0156) Resp:  [12-21] 20 (11/16 0156) BP: (93-141)/(39-89) 104/45 (11/16 0156) SpO2:  [93 %-100 %] 97 % (11/16 0156) Weight:  [114.8 kg (253 lb)] 114.8 kg (253 lb) (11/15 1513)  Intake/Output from previous day: 11/15 0701 - 11/16 0700 In: [id number] [P.O.:240; I.V.:1300; IV Piggyback:55] Out: 1150 [Urine:1050; Blood:100] Intake/Output this shift: Total I/O In: -  Out: 300 [Urine:300]  No results for input(s): HGB in the last 72 hours. No results for input(s): WBC, RBC, HCT, PLT in the last 72 hours. No results for input(s): NA, K, CL, CO2, BUN, CREATININE, GLUCOSE, CALCIUM in the last 72 hours. No results for input(s): LABPT, INR in the last 72 hours.  Neurologically intact  Assessment/Plan: 1 Day Post-Op Procedure(s) (LRB): CENTRAL LUMBAR DECOMPRESSION MICRODISCECTOMY L4-L5, MICRODISECTOMY L4-L5 LEFT; FORAMINOTOMY L4 AND L5 ROOT LEFT (Left) Up with therapy Discharge home with home health  Leialoha Hanna A 11/22/2015, 6:29 AM

## 2015-11-22 NOTE — Evaluation (Signed)
Occupational Therapy Evaluation Patient Details Name: Kayla Jacobson MRN: [id number] DOB: 06-Aug-1975 Today's Date: 11/22/2015    History of Present Illness Pt had CENTRAL LUMBAR DECOMPRESSION MICRODISCECTOMY L4-L5, MICRODISECTOMY L4-L5 LEFT; FORAMINOTOMY L4 AND L5 ROOT LEFT    Clinical Impression   Education complete regarding ADL activity s/p back surgery. DME ordered.  Husband will A as needed    Follow Up Recommendations  No OT follow up    Equipment Recommendations  3 in 1 bedside comode    Recommendations for Other Services       Precautions / Restrictions Precautions Precautions: Back Restrictions Weight Bearing Restrictions: No      Mobility Bed Mobility Overal bed mobility: Needs Assistance Bed Mobility: Sidelying to Sit   Sidelying to sit: Min assist          Transfers Overall transfer level: Needs assistance Equipment used: Rolling walker (2 wheeled) Transfers: Sit to/from [facility] Sit to Stand: Min guard Stand pivot transfers: Min guard       General transfer comment: VC for safety and back precautions         ADL Overall ADL's : Needs assistance/impaired                     Lower Body Dressing: Minimal assistance;Sit to/from stand;Cueing for back precautions;Cueing for safety;Cueing for compensatory techniques;Cueing for sequencing   Toilet Transfer: Min guard;RW;Ambulation;Cueing for sequencing;Cueing for safety;Comfort height toilet   Toileting- Clothing Manipulation and Hygiene: Min guard;Sit to/from stand;Cueing for safety;Cueing for sequencing;Cueing for compensatory techniques;Cueing for back precautions   Tub/ Shower Transfer: Min guard;Ambulation;Cueing for safety;Cueing for sequencing;Adhering to back precautions   Functional mobility during ADLs: Min guard;Cueing for safety;Cueing for sequencing;Caregiver able to provide necessary level of assistance                 Pertinent  Vitals/Pain Pain Assessment: 0-10 Pain Score: 4  Pain Location: back Pain Descriptors / Indicators: Sore Pain Intervention(s): Monitored during session;Repositioned     Hand Dominance     Extremity/Trunk Assessment Upper Extremity Assessment Upper Extremity Assessment: Overall WFL for tasks assessed           Communication Communication Communication: No difficulties   Cognition Arousal/Alertness: Awake/alert Behavior During Therapy: WFL for tasks assessed/performed Overall Cognitive Status: Within Functional Limits for tasks assessed                                Home Living Family/patient expects to be discharged to:: Private residence Living Arrangements: Spouse/significant other Available Help at Discharge: Family Type of Home: House       Home Layout: One level     Bathroom Shower/Tub: Occupational psychologist: Standard     Home Equipment: None          Prior Functioning/Environment Level of Independence: Independent                       OT Goals(Current goals can be found in the care plan section) Acute Rehab OT Goals Patient Stated Goal: less pain OT Goal Formulation: With patient  OT Frequency:                End of Session Equipment Utilized During Treatment: Rolling walker Nurse Communication: Mobility status  Activity Tolerance: Patient tolerated treatment well Patient left: in bed;with call bell/phone within reach;with family/visitor present   Time: [id number] OT  Time Calculation (min): 34 min Charges:  OT General Charges $OT Visit: 1 Procedure OT Evaluation $OT Eval Low Complexity: 1 Procedure OT Treatments $Self Care/Home Management : 8-22 mins G-Codes: OT G-codes **NOT FOR INPATIENT CLASS** Functional Assessment Tool Used: clinical observation Functional Limitation: Self care Self Care Current Status [id number]): At least 20 percent but less than 40 percent impaired, limited or restricted Self Care  Goal Status [id number]): At least 1 percent but less than 20 percent impaired, limited or restricted Self Care Discharge Status [phone]): At least 20 percent but less than 40 percent impaired, limited or restricted  [place], Thereasa Parkin 11/22/2015, 11:39 AM

## 2015-11-26 NOTE — Discharge Summary (Signed)
Physician Discharge Summary   Patient ID: Kayla Jacobson MRN: [id number] DOB/AGE: [birth year] 40 y.o.  Admit date: 11/21/2015 Discharge date: 11/22/2015  Primary Diagnosis: Lumbar disc herniation L4-L5 left  Admission Diagnoses:  Past Medical History:  Diagnosis Date  . Anxiety   . Arthritis   . Chiari malformation   . Depression    bipolar  . Dysrhythmia   . Fibromyalgia   . Headache    migraines  . Seasonal asthma    Discharge Diagnoses:   Active Problems:   Herniated intervertebral disc of lumbar spine  Estimated body mass index is 39.63 kg/m as calculated from the following:   Height as of this encounter: '5\' 7"'$  (1.702 m).   Weight as of this encounter: 114.8 kg (253 lb).  Procedure:  Procedure(s) (LRB): CENTRAL LUMBAR DECOMPRESSION MICRODISCECTOMY L4-L5, MICRODISECTOMY L4-L5 LEFT; FORAMINOTOMY L4 AND L5 ROOT LEFT (Left)   Consults: None  HPI: The patient presented with the chief complaint of low back pain with progressively worsening pain in the left leg. She also developed weakness in the left EHL. She was unresponsive to conservative treatments including activity modification and corticosteroids. MRI showed a disc herniation at L4-L5 on the left.  Laboratory Data: Hospital Outpatient Visit on 11/14/2015  Component Date Value Ref Range Status  . aPTT 11/14/2015 29  24 - 36 seconds Final  . WBC 11/14/2015 5.5  4.0 - 10.5 K/uL Final  . RBC 11/14/2015 3.95  3.87 - 5.11 MIL/uL Final  . Hemoglobin 11/14/2015 12.3  12.0 - 15.0 g/dL Final  . HCT 11/14/2015 36.2  36.0 - 46.0 % Final  . MCV 11/14/2015 91.6  78.0 - 100.0 fL Final  . MCH 11/14/2015 31.1  26.0 - 34.0 pg Final  . MCHC 11/14/2015 34.0  30.0 - 36.0 g/dL Final  . RDW 11/14/2015 13.0  11.5 - 15.5 % Final  . Platelets 11/14/2015 226  150 - 400 K/uL Final  . Neutrophils Relative % 11/14/2015 65  % Final  . Neutro Abs 11/14/2015 3.6  1.7 - 7.7 K/uL Final  . Lymphocytes Relative 11/14/2015 26  % Final  .  Lymphs Abs 11/14/2015 1.5  0.7 - 4.0 K/uL Final  . Monocytes Relative 11/14/2015 6  % Final  . Monocytes Absolute 11/14/2015 0.3  0.1 - 1.0 K/uL Final  . Eosinophils Relative 11/14/2015 3  % Final  . Eosinophils Absolute 11/14/2015 0.2  0.0 - 0.7 K/uL Final  . Basophils Relative 11/14/2015 0  % Final  . Basophils Absolute 11/14/2015 0.0  0.0 - 0.1 K/uL Final  . Sodium 11/14/2015 142  135 - 145 mmol/L Final  . Potassium 11/14/2015 3.5  3.5 - 5.1 mmol/L Final  . Chloride 11/14/2015 109  101 - 111 mmol/L Final  . CO2 11/14/2015 27  22 - 32 mmol/L Final  . Glucose, Bld 11/14/2015 108* 65 - 99 mg/dL Final  . BUN 11/14/2015 10  6 - 20 mg/dL Final  . Creatinine, Ser 11/14/2015 0.67  0.44 - 1.00 mg/dL Final  . Calcium 11/14/2015 8.8* 8.9 - 10.3 mg/dL Final  . Total Protein 11/14/2015 6.5  6.5 - 8.1 g/dL Final  . Albumin 11/14/2015 3.7  3.5 - 5.0 g/dL Final  . AST 11/14/2015 21  15 - 41 U/L Final  . ALT 11/14/2015 16  14 - 54 U/L Final  . Alkaline Phosphatase 11/14/2015 71  38 - 126 U/L Final  . Total Bilirubin 11/14/2015 0.3  0.3 - 1.2 mg/dL Final  . GFR calc non  Af Amer 11/14/2015 >60  >60 mL/min Final  . GFR calc Af Amer 11/14/2015 >60  >60 mL/min Final   Comment: (NOTE) The eGFR has been calculated using the CKD EPI equation. This calculation has not been validated in all clinical situations. eGFR's persistently <60 mL/min signify possible Chronic Kidney Disease.   . Anion gap 11/14/2015 6  5 - 15 Final  . Prothrombin Time 11/14/2015 12.6  11.4 - 15.2 seconds Final  . INR 11/14/2015 0.95   Final  . ABO/RH(D) 11/21/2015 O POS   Final  . Antibody Screen 11/21/2015 NEG   Final  . Sample Expiration 11/21/2015 11/24/2015   Final  . Extend sample reason 11/21/2015 NO TRANSFUSIONS OR PREGNANCY IN THE PAST 3 MONTHS   Final  . MRSA, PCR 11/14/2015 NEGATIVE  NEGATIVE Final  . Staphylococcus aureus 11/14/2015 NEGATIVE  NEGATIVE Final   Comment:        The Xpert SA Assay (FDA approved for  NASAL specimens in patients over 3 years of age), is one component of a comprehensive surveillance program.  Test performance has been validated by [facility] for patients greater than or equal to 72 year old. It is not intended to diagnose infection nor to guide or monitor treatment.   . Preg, Serum 11/14/2015 NEGATIVE  NEGATIVE Final   Comment:        THE SENSITIVITY OF THIS METHODOLOGY IS >10 mIU/mL.   . ABO/RH(D) 11/14/2015 O POS   Final     X-Rays:Dg Chest 2 View  Result Date: 11/14/2015 CLINICAL DATA:  Preop for lumbar surgery EXAM: CHEST  2 VIEW COMPARISON:  10/ 26/10 FINDINGS: Cardiomediastinal silhouette is stable. Mild lower thoracic dextroscoliosis. No infiltrate or pleural effusion. No pulmonary edema. Bony thorax is unremarkable. IMPRESSION: No active cardiopulmonary disease.  Mild thoracic dextroscoliosis. Electronically Signed   By: Lahoma Crocker M.D.   On: 11/14/2015 13:56   Dg Lumbar Spine 2-3 Views  Result Date: 11/14/2015 CLINICAL DATA:  Patient for lumbar surgery. Preoperative examination. EXAM: LUMBAR SPINE - 2-3 VIEW COMPARISON:  MRI lumbar spine 05/30/2009. FINDINGS: Vertebral body height and alignment are maintained. There is some loss disc space height at L4-5 and L5-S1. The facet joints are unremarkable. Paraspinous structures appear normal. IMPRESSION: Lower lumbar spondylosis.  Otherwise negative. Electronically Signed   By: Inge Rise M.D.   On: 11/14/2015 13:58   Dg Spine Portable 1 View  Result Date: 11/21/2015 CLINICAL DATA:  L4-5 lumbar decompression on the left. EXAM: PORTABLE SPINE - 1 VIEW COMPARISON:  11/21/2015 and 11/14/2015. FINDINGS: Numbering system utilized on the prior examination is preserved. Surgical instrument tips project posterior to L4 and L5. IMPRESSION: Intraoperative localization at L4 and L5. Electronically Signed   By: Lorin Picket M.D.   On: 11/21/2015 12:44   Dg Spine Portable 1 View  Result Date: 11/21/2015 CLINICAL  DATA:  Lumbar surgery, intraoperative assessment for localization. EXAM: PORTABLE SPINE - 1 VIEW COMPARISON:  11/21/2015 at 1131 hours FINDINGS: Today' s image labeled # 2 demonstrates a posterior probe projecting between the L4 and L5 spinous processes, oriented towards the upper posterior margin of the L5 vertebral body. IMPRESSION: 1. Probe localization of the L4-5 interspinous space. Electronically Signed   By: Van Clines M.D.   On: 11/21/2015 12:27   Dg Spine Portable 1 View  Result Date: 11/21/2015 CLINICAL DATA:  L4-5 decompression. EXAM: PORTABLE SPINE - 1 VIEW COMPARISON:  MRI of 09/05/2015 FINDINGS: Intraoperative lateral View at 1131 hours. This demonstrates surgical  devices projecting posterior to L3-4 and L4-5 levels. Loss of intervertebral disc height at L4-5. IMPRESSION: Intraoperative localization. Electronically Signed   By: Abigail Miyamoto M.D.   On: 11/21/2015 12:04    EKG: Orders placed or performed during the hospital encounter of 11/14/15  . EKG  . EKG     Hospital Course: Kayla Jacobson is a 40 y.o. who was admitted to [facility]. They were brought to the operating room on 11/21/2015 and underwent Procedure(s): CENTRAL LUMBAR DECOMPRESSION MICRODISCECTOMY L4-L5, MICRODISECTOMY L4-L5 LEFT; FORAMINOTOMY L4 AND L5 ROOT LEFT.  Patient tolerated the procedure well and was later transferred to the recovery room and then to the orthopaedic floor for postoperative care.  They were given PO and IV analgesics for pain control following their surgery.  They were given 24 hours of postoperative antibiotics of  Anti-infectives    Start     Dose/Rate Route Frequency Ordered Stop   11/21/15 2000  ceFAZolin (ANCEF) IVPB 1 g/50 mL premix  Status:  Discontinued     1 g 100 mL/hr over 30 Minutes Intravenous Every 8 hours 11/21/15 1521 11/22/15 1556   11/21/15 1157  polymyxin B 500,000 Units, bacitracin 50,000 Units in sodium chloride irrigation 0.9 % 500 mL irrigation   Status:  Discontinued       As needed 11/21/15 1211 11/21/15 1335   11/21/15 0600  ceFAZolin (ANCEF) 3 g in dextrose 5 % 50 mL IVPB     3 g 130 mL/hr over 30 Minutes Intravenous On call to O.R. 11/20/15 1347 11/21/15 1134     and started on DVT prophylaxis in the form of Aspirin.   PT and OT were ordered. Discharge planning consulted to help with postop disposition and equipment needs.  Patient had a good night on the evening of surgery.  They started to get up OOB with therapy on day one.  Dressing was changed and the incision was clean and dry.  The patient had progressed with therapy and meeting their goals.  Incision was healing well.  Patient was seen in rounds and was ready to go home.   Diet: Cardiac diet Activity:WBAT Follow-up:in 2 weeks Disposition - Home Discharged Condition: stable   Discharge Instructions    Call MD / Call 911    Complete by:  As directed    If you experience chest pain or shortness of breath, CALL 911 and be transported to the hospital emergency room.  If you develope a fever above 101 F, pus (white drainage) or increased drainage or redness at the wound, or calf pain, call your surgeon's office.   Constipation Prevention    Complete by:  As directed    Drink plenty of fluids.  Prune juice may be helpful.  You may use a stool softener, such as Colace (over the counter) 100 mg twice a day.  Use MiraLax (over the counter) for constipation as needed.   Diet - low sodium heart healthy    Complete by:  As directed    Discharge instructions    Complete by:  As directed    For the first three days, remove your dressing, tape a piece of saran wrap over your incision. Take your shower, then remove the saran wrap and put a clean dressing on., then reapply your sling. After three days you can shower without the saran wrap.  Do not take your blood pressure medication if your blood pressure is less than 130/80 Call Dr. Gladstone Lighter if any wound complications  or temperature  of 101 degrees F or over.  Call the office for an appointment to see Dr. Gladstone Lighter in two weeks: [phone] and ask for Dr. Charlestine Night nurse, Brunilda Payor.   Increase activity slowly as tolerated    Complete by:  As directed        Medication List    TAKE these medications   ALPRAZolam 1 MG tablet Commonly known as:  XANAX Take 1 mg by mouth 2 (two) times daily as needed for anxiety.   amphetamine-dextroamphetamine 20 MG tablet Commonly known as:  ADDERALL Take 20 mg by mouth 2 (two) times daily as needed.   aspirin EC 325 MG tablet Take 1 tablet (325 mg total) by mouth daily.   BIOFREEZE 4 % Gel Generic drug:  Menthol (Topical Analgesic) Apply 1 application topically as needed.   butalbital-acetaminophen-caffeine 50-325-40 MG tablet Commonly known as:  FIORICET, ESGIC Take 1 tablet by mouth 2 (two) times daily as needed for headache.   carisoprodol 350 MG tablet Commonly known as:  SOMA Take 350 mg by mouth 2 (two) times daily as needed for muscle spasms.   escitalopram 10 MG tablet Commonly known as:  LEXAPRO Take 10 mg by mouth every morning.   lamoTRIgine 150 MG tablet Commonly known as:  LAMICTAL Take 150 mg by mouth 2 (two) times daily.   lisdexamfetamine 70 MG capsule Commonly known as:  VYVANSE Take 70 mg by mouth daily.   oxyCODONE 5 MG immediate release tablet Commonly known as:  Oxy IR/ROXICODONE Take 1-3 tablets (5-15 mg total) by mouth every 4 (four) hours as needed for moderate pain or severe pain. What changed:  medication strength  how much to take  when to take this  reasons to take this   triamterene-hydrochlorothiazide 37.5-25 MG tablet Commonly known as:  MAXZIDE-25 Take 1 tablet by mouth daily.      Follow-up Information    GIOFFRE,RONALD A, MD. Schedule an appointment as soon as possible for a visit in 2 week(s).   Specialty:  Orthopedic Surgery Contact information: [address] [place]  [address] [phone]        [facility] Follow up.   Why:  rolling walker and 3n1 Contact information: [place] [address] [phone]           Signed: Ardeen Jourdain, PA-C Orthopaedic Surgery 11/26/2015, 9:02 AM

## 2016-02-27 ENCOUNTER — Encounter (HOSPITAL_COMMUNITY): Payer: Self-pay | Admitting: Emergency Medicine

## 2016-02-27 ENCOUNTER — Emergency Department (HOSPITAL_COMMUNITY)
Admission: EM | Admit: 2016-02-27 | Discharge: 2016-02-28 | Disposition: A | Discharge status: Home / Self Care | Payer: 59 | Attending: Emergency Medicine | Admitting: Emergency Medicine

## 2016-02-27 ENCOUNTER — Emergency Department (HOSPITAL_COMMUNITY): Payer: 59

## 2016-02-27 DIAGNOSIS — Z87891 Personal history of nicotine dependence: Secondary | ICD-10-CM | POA: Insufficient documentation

## 2016-02-27 DIAGNOSIS — J45909 Unspecified asthma, uncomplicated: Secondary | ICD-10-CM | POA: Diagnosis not present

## 2016-02-27 DIAGNOSIS — Z7982 Long term (current) use of aspirin: Secondary | ICD-10-CM | POA: Diagnosis not present

## 2016-02-27 DIAGNOSIS — R079 Chest pain, unspecified: Secondary | ICD-10-CM | POA: Insufficient documentation

## 2016-02-27 DIAGNOSIS — R061 Stridor: Secondary | ICD-10-CM | POA: Diagnosis not present

## 2016-02-27 DIAGNOSIS — R0789 Other chest pain: Secondary | ICD-10-CM | POA: Diagnosis not present

## 2016-02-27 LAB — BASIC METABOLIC PANEL
ANION GAP: 8 (ref 5–15)
BUN: 7 mg/dL (ref 6–20)
CALCIUM: 8.9 mg/dL (ref 8.9–10.3)
CO2: 27 mmol/L (ref 22–32)
Chloride: 103 mmol/L (ref 101–111)
Creatinine, Ser: 0.73 mg/dL (ref 0.44–1.00)
GFR calc Af Amer: >60 mL/min (ref >60)
GLUCOSE: 101 mg/dL — AB (ref 65–99)
Potassium: 3.5 mmol/L (ref 3.5–5.1)
Sodium: 138 mmol/L (ref 135–145)

## 2016-02-27 LAB — CBC
HCT: 38.5 % (ref 36.0–46.0)
Hemoglobin: 13 g/dL (ref 12.0–15.0)
MCH: 30.9 pg (ref 26.0–34.0)
MCHC: 33.8 g/dL (ref 30.0–36.0)
MCV: 91.4 fL (ref 78.0–100.0)
Platelets: 217 10*3/uL (ref 150–400)
RBC: 4.21 MIL/uL (ref 3.87–5.11)
RDW: 13.5 % (ref 11.5–15.5)
WBC: 8.1 10*3/uL (ref 4.0–10.5)

## 2016-02-27 LAB — I-STAT TROPONIN, ED: TROPONIN I, POC: 0 ng/mL (ref 0.00–0.08)

## 2016-02-27 MED ORDER — SODIUM CHLORIDE 0.9 % IV BOLUS (SEPSIS)
1000.0000 mL | Freq: Once | INTRAVENOUS | Status: AC
  Administered 2016-02-27: 1000 mL via INTRAVENOUS

## 2016-02-27 NOTE — ED Triage Notes (Signed)
Pt arrives via [facility] for CP starting approx 40 minutes ago, started while cooking. States worse with inspiration but pain relieved by holding her breath. Radiates to R arm and neck. 324 MG aspirin on board, 1 SL nitro and 6MG  morphine on board. Reports increased stress lately.

## 2016-02-27 NOTE — ED Provider Notes (Signed)
[facility] DEPT Provider Note   CSN: [id number] Arrival date & time: 02/27/16  2141     History   Chief Complaint Chief Complaint  Patient presents with  . Chest Pain    HPI Kayla Jacobson is a 41 y.o. female.  HPI Patient arrives with chest pain from home accompanied by her husband States she's had severe chest pain today after significant anxiety She does have a history of bipolar disorder and has had significant home stressors Today she was in her usual state of health when she suddenly developed chest pain that radiated to her shoulder It was sharp and pressure-like She did not feel short of breath but later her husband notes that she was breathing rapidly He states that she appeared very stressed and started breathing rapidly, her head turning red She had to lay down on the bathroom floor and felt nauseous When EMS arrived, she received nitroglycerin, aspirin and morphine which made her blood pressure come down Patient states that her blood pressure was initially elevated when her husband checked it at home She is not on any estrogen, no recent immobilization, no history of any blood clots, no symptoms or signs of DVT, no hemoptysis, chest pain is not pleuritic No recent infectious symptoms  Past Medical History:  Diagnosis Date  . Anxiety   . Arthritis   . Chiari malformation   . Depression    bipolar  . Dysrhythmia   . Fibromyalgia   . Headache    migraines  . Seasonal asthma     Patient Active Problem List   Diagnosis Date Noted  . Herniated intervertebral disc of lumbar spine 11/21/2015    Past Surgical History:  Procedure Laterality Date  . ABLATION    . CARPAL TUNNEL RELEASE     bilateral  . CHOLECYSTECTOMY    . HYSTEROSCOPY W/ ENDOMETRIAL ABLATION    . LUMBAR LAMINECTOMY/DECOMPRESSION MICRODISCECTOMY Left 11/21/2015   Procedure: CENTRAL LUMBAR DECOMPRESSION MICRODISCECTOMY L4-L5, MICRODISECTOMY L4-L5 LEFT; FORAMINOTOMY L4 AND L5 ROOT  LEFT;  Surgeon: Latanya Maudlin, MD;  Location: [facility]S;  Service: Orthopedics;  Laterality: Left;  . TUBAL LIGATION    . WISDOM TOOTH EXTRACTION      OB History    No data available       Home Medications    Prior to Admission medications   Medication Sig Start Date End Date Taking? Authorizing Provider  ALPRAZolam Duanne Moron) 1 MG tablet Take 1 mg by mouth 2 (two) times daily as needed for anxiety.   Yes Historical Provider, MD  amphetamine-dextroamphetamine (ADDERALL) 20 MG tablet Take 20 mg by mouth daily.    Yes Historical Provider, MD  aspirin-acetaminophen-caffeine (EXCEDRIN MIGRAINE) [phone] MG tablet Take 2 tablets by mouth every 6 (six) hours as needed for headache.   Yes Historical Provider, MD  butalbital-acetaminophen-caffeine (FIORICET, ESGIC) 50-325-40 MG tablet Take 1 tablet by mouth 2 (two) times daily as needed for headache.   Yes Historical Provider, MD  carisoprodol (SOMA) 350 MG tablet Take 350 mg by mouth 2 (two) times daily as needed for muscle spasms.   Yes Historical Provider, MD  escitalopram (LEXAPRO) 20 MG tablet Take 20 mg by mouth daily.   Yes Historical Provider, MD  lamoTRIgine (LAMICTAL) 150 MG tablet Take 150 mg by mouth 2 (two) times daily.   Yes Historical Provider, MD  lisdexamfetamine (VYVANSE) 70 MG capsule Take 70 mg by mouth daily.   Yes Historical Provider, MD  triamterene-hydrochlorothiazide (MAXZIDE-25) 37.5-25 MG tablet Take 1 tablet  by mouth daily.   Yes Historical Provider, MD    Family History Family History  Problem Relation Age of Onset  . Uterine cancer Mother   . Breast cancer      grandmother  . Diabetes Father     Social History Social History  Substance Use Topics  . Smoking status: Former Smoker    Types: E-cigarettes  . Smokeless tobacco: Never Used  . Alcohol use Yes     Comment: occasional-states 2 month     Allergies   Other and Advair diskus [fluticasone-salmeterol]   Review of Systems Review of Systems    Constitutional: Negative for fever.  Allergic/Immunologic: Negative for immunocompromised state.  All other systems reviewed and are negative.    Physical Exam Updated Vital Signs BP (!) 97/50   Pulse 60   Temp 98.2 F (36.8 C) (Oral)   Resp 23   SpO2 96%   Physical Exam  Constitutional: She appears well-developed and well-nourished. No distress.  HENT:  Head: Normocephalic and atraumatic.  Eyes: Conjunctivae are normal. Right eye exhibits no discharge. Left eye exhibits no discharge.  Neck: Normal range of motion. Neck supple. No JVD present.  Cardiovascular: Regular rhythm, normal heart sounds and intact distal pulses.   No murmur heard. Slight bradycardia, sinus  Pulmonary/Chest: Effort normal and breath sounds normal. No respiratory distress.  Abdominal: Soft. Bowel sounds are normal. She exhibits no distension and no mass. There is no tenderness. There is no rebound and no guarding.  Musculoskeletal: She exhibits no edema (no calf tenderness).  Neurological: She is alert.  Skin: Skin is warm. No rash noted.  Psychiatric:  Appears very anxious  Nursing note and vitals reviewed.    ED Treatments / Results  Labs (all labs ordered are listed, but only abnormal results are displayed) Labs Reviewed  BASIC METABOLIC PANEL - Abnormal; Notable for the following:       Result Value   Glucose, Bld 101 (*)    All other components within normal limits  CBC  I-STAT TROPOININ, ED  I-STAT TROPOININ, ED    EKG  EKG Interpretation  Date/Time:  Wednesday February 27 2016 22:28:02 EST Ventricular Rate:  63 PR Interval:  164 QRS Duration: 90 QT Interval:  418 QTC Calculation: 428 R Axis:   44 Text Interpretation:  Sinus rhythm Low voltage, precordial leads No acute changes No significant change since last tracing Confirmed by Kathrynn Humble, MD, Thelma Comp [phone]) on 02/27/2016 11:46:44 PM       Radiology Dg Chest 2 View  Result Date: 02/27/2016 CLINICAL DATA:  Initial  evaluation for acute central chest pain radiating to the arms bilaterally. EXAM: CHEST  2 VIEW COMPARISON:  Prior radiograph from 11/14/2015. FINDINGS: The cardiac and mediastinal silhouettes are stable in size and contour, and remain within normal limits. The lungs are normally inflated. No airspace consolidation, pleural effusion, or pulmonary edema is identified. There is no pneumothorax. No acute osseous abnormality identified.  Mild scoliosis noted. IMPRESSION: No active cardiopulmonary disease. Electronically Signed   By: Jeannine Boga M.D.   On: 02/27/2016 22:25    Procedures Procedures (including critical care time)  Medications Ordered in ED Medications  sodium chloride 0.9 % bolus 1,000 mL (0 mLs Intravenous Stopped 02/28/16 0123)     Initial Impression / Assessment and Plan / ED Course  I have reviewed the triage vital signs and the nursing notes.  Pertinent labs & imaging results that were available during my care of the patient were reviewed  by me and considered in my medical decision making (see chart for details).     Chest pain history is suggestive for anxiety attack Patient is a poor historian but symptoms are not consisted with ACS. EKG not suggestive of such and initial troponin is negative.  Pain is not pleuritic, she has no hemoptysis, not on any estrogen, no symptoms or signs of DVT, no recent surgery or immobilization, vital signs reassuring without tachycardia or hypoxia. PERC negative, Wells 0 - no PE evaluation needed Chest x-ray without pneumonia no infectious symptoms Patient is a low HEAR score - no need for emergent stress test Patient has had a lot of stress recently and her family and likely trigger Did have soft blood pressures after receiving morphine, this resolved after fluids Second troponin revealed neg, undetec Discussed with patient likely etiology but emphasize strict return precautions including diaphoresis, severe chest pain, shortness of  breath, inability to tolerate by mouth, tearing chest pain or other concerning symptoms She agrees to follow-up with her primary care provider   Final Clinical Impressions(s) / ED Diagnoses   Final diagnoses:  Chest pain, unspecified type    New Prescriptions New Prescriptions   No medications on file     Karma Greaser, MD 02/28/16 [id number]    Varney Biles, MD 02/28/16 1524

## 2016-02-27 NOTE — ED Notes (Signed)
Patient transported to X-ray 

## 2016-02-27 NOTE — ED Notes (Signed)
Kayla Song, MD Res at bedside

## 2016-02-28 LAB — I-STAT TROPONIN, ED: TROPONIN I, POC: 0 ng/mL (ref 0.00–0.08)

## 2016-04-12 DIAGNOSIS — H04123 Dry eye syndrome of bilateral lacrimal glands: Secondary | ICD-10-CM | POA: Diagnosis not present

## 2016-04-12 DIAGNOSIS — H40033 Anatomical narrow angle, bilateral: Secondary | ICD-10-CM | POA: Diagnosis not present

## 2016-05-07 DIAGNOSIS — Z4789 Encounter for other orthopedic aftercare: Secondary | ICD-10-CM | POA: Diagnosis not present

## 2016-05-07 DIAGNOSIS — G8929 Other chronic pain: Secondary | ICD-10-CM | POA: Diagnosis not present

## 2016-05-07 DIAGNOSIS — M5442 Lumbago with sciatica, left side: Secondary | ICD-10-CM | POA: Diagnosis not present

## 2016-05-09 DIAGNOSIS — G8929 Other chronic pain: Secondary | ICD-10-CM | POA: Diagnosis not present

## 2016-05-09 DIAGNOSIS — M5442 Lumbago with sciatica, left side: Secondary | ICD-10-CM | POA: Diagnosis not present

## 2016-05-20 DIAGNOSIS — G8929 Other chronic pain: Secondary | ICD-10-CM | POA: Diagnosis not present

## 2016-05-20 DIAGNOSIS — M5442 Lumbago with sciatica, left side: Secondary | ICD-10-CM | POA: Diagnosis not present

## 2016-06-23 DIAGNOSIS — J40 Bronchitis, not specified as acute or chronic: Secondary | ICD-10-CM | POA: Diagnosis not present

## 2016-06-23 DIAGNOSIS — J029 Acute pharyngitis, unspecified: Secondary | ICD-10-CM | POA: Diagnosis not present

## 2016-07-11 DIAGNOSIS — J0141 Acute recurrent pansinusitis: Secondary | ICD-10-CM | POA: Diagnosis not present

## 2016-08-28 MED FILL — VYVANSE 70 MG CAPSULE: 70 | 30 days supply | Qty: 30 | Fill #0

## 2016-08-30 DIAGNOSIS — E559 Vitamin D deficiency, unspecified: Secondary | ICD-10-CM | POA: Diagnosis not present

## 2016-08-30 DIAGNOSIS — M2550 Pain in unspecified joint: Secondary | ICD-10-CM | POA: Diagnosis not present

## 2016-09-19 ENCOUNTER — Encounter: Payer: Self-pay | Admitting: Family Medicine

## 2016-09-19 ENCOUNTER — Ambulatory Visit (INDEPENDENT_AMBULATORY_CARE_PROVIDER_SITE_OTHER): Payer: 59 | Admitting: Family Medicine

## 2016-09-19 VITALS — BP 127/86 | HR 76 | Temp 99.1°F | Ht 67.0 in | Wt 248.0 lb

## 2016-09-19 DIAGNOSIS — M9906 Segmental and somatic dysfunction of lower extremity: Secondary | ICD-10-CM | POA: Diagnosis not present

## 2016-09-19 DIAGNOSIS — M25562 Pain in left knee: Secondary | ICD-10-CM

## 2016-09-19 MED ORDER — NAPROXEN 500 MG PO TBEC: 500.0000 mg | DELAYED_RELEASE_TABLET | Freq: Two times a day (BID) | ORAL | 0 refills | Status: DC

## 2016-09-19 MED FILL — NAPROXEN DR 500 MG TABLET: 500 | 15 days supply | Qty: 30 | Fill #0

## 2016-09-19 NOTE — Progress Notes (Signed)
Pre visit review using our clinic review tool, if applicable. No additional management support is needed unless otherwise documented below in the visit note. 

## 2016-09-19 NOTE — Patient Instructions (Addendum)
Knee Exercises Ask your health care provider which exercises are safe for you. Do exercises exactly as told by your health care provider and adjust them as directed. It is normal to feel mild stretching, pulling, tightness, or discomfort as you do these exercises, but you should stop right away if you feel sudden pain or your pain gets worse.Do not begin these exercises until told by your health care provider. STRETCHING AND RANGE OF MOTION EXERCISES  These exercises warm up your muscles and joints and improve the movement and flexibility of your knee. These exercises also help to relieve pain, numbness, and tingling. Exercise A: Knee Extension, Prone  1. Lie on your abdomen on a bed. 2. Place your left / right knee just beyond the edge of the surface so your knee is not on the bed. You can put a towel under your left / right thigh just above your knee for comfort. 3. Relax your leg muscles and allow gravity to straighten your knee. You should feel a stretch behind your left / right knee. 4. Hold this position for 15-20 seconds. 5. Scoot up so your knee is supported between repetitions. Repeat 2-3 times. Complete this stretch 1 time a day. Exercise B: Knee Flexion, Active    1. Lie on your back with both knees straight. If this causes back discomfort, bend your left / right knee so your foot is flat on the floor. 2. Slowly slide your left / right heel back toward your buttocks until you feel a gentle stretch in the front of your knee or thigh. 3. Hold this position for 15-20 seconds. 4. Slowly slide your left / right heel back to the starting position. Repeat 2-3 times. Complete this exercise 1 time a day. Exercise C: Quadriceps, Prone    1. Lie on your abdomen on a firm surface, such as a bed or padded floor. 2. Bend your left / right knee and hold your ankle. If you cannot reach your ankle or pant leg, loop a belt around your foot and grab the belt instead. 3. Gently pull your heel  toward your buttocks. Your knee should not slide out to the side. You should feel a stretch in the front of your thigh and knee. 4. Hold this position for 15-20 seconds. Repeat 2-3 times. Complete this stretch 1 time a day. Exercise D: Hamstring, Supine  1. Lie on your back. 2. Loop a belt or towel over the ball of your left / right foot. The ball of your foot is on the walking surface, right under your toes. 3. Straighten your left / right knee and slowly pull on the belt to raise your leg until you feel a gentle stretch behind your knee. ? Do not let your left / right knee bend while you do this. ? Keep your other leg flat on the floor. 4. Hold this position for 15-20 seconds. Repeat 2-3 times. Complete this stretch 1 time a day. STRENGTHENING EXERCISES  These exercises build strength and endurance in your knee. Endurance is the ability to use your muscles for a long time, even after they get tired. Exercise E: Quadriceps, Isometric    1. Lie on your back with your left / right leg extended and your other knee bent. Put a rolled towel or small pillow under your knee if told by your health care provider. 2. Slowly tense the muscles in the front of your left / right thigh. You should see your kneecap slide up toward your  hip or see increased dimpling just above the knee. This motion will push the back of the knee toward the floor. 3. For 15 seconds, keep the muscle as tight as you can without increasing your pain. 4. Relax the muscles slowly and completely. Repeat 1 times. Complete this exercise every other day. Exercise F: Straight Leg Raises - Quadriceps  1. Lie on your back with your left / right leg extended and your other knee bent. 2. Tense the muscles in the front of your left / right thigh. You should see your kneecap slide up or see increased dimpling just above the knee. Your thigh may even shake a bit. 3. Keep these muscles tight as you raise your leg 4-6 inches (10-15 cm) off the  floor. Do not let your knee bend. 4. Hold this position for 15 seconds. 5. Keep these muscles tense as you lower your leg. 6. Relax your muscles slowly and completely after each repetition. Repeat 2 times. Complete this exercise every other day.  Exercise G: Hamstring Curls    If told by your health care provider, do this exercise while wearing ankle weights. Begin with 5 lb weights (optional). Then increase the weight by 1 lb (0.5 kg) increments. Do not wear ankle weights that are more than 20 lbs to start with. 1. Lie on your abdomen with your legs straight. 2. Bend your left / right knee as far as you can without feeling pain. Keep your hips flat against the floor. 3. Hold this position for 10 seconds. 4. Slowly lower your leg to the starting position. Repeat 2 times. Complete this exercise every other day. Exercise H: Squats (Quadriceps)  1. Stand in front of a table, with your feet and knees pointing straight ahead. You may rest your hands on the table for balance but not for support. 2. Slowly bend your knees and lower your hips like you are going to sit in a chair. ? Keep your weight over your heels, not over your toes. ? Keep your lower legs upright so they are parallel with the table legs. ? Do not let your hips go lower than your knees. ? Do not bend lower than told by your health care provider. ? If your knee pain increases, do not bend as low. 3. Hold the squat position for 10 seconds. 4. Slowly push with your legs to return to standing. Do not use your hands to pull yourself to standing. Repeat 2 times. Complete this exercise every otherday. Exercise I: Wall Slides (Quadriceps)    1. Lean your back against a smooth wall or door while you walk your feet out 18-24 inches (46-61 cm) from it. 2. Place your feet hip-width apart. 3. Slowly slide down the wall or door until your knees Repeat 2 times. Complete this exercise every other day. 4. Exercise K: Straight Leg Raises -  Hip Abductors  1. Lie on your side with your left / right leg in the top position. Lie so your head, shoulder, knee, and hip line up. You may bend your bottom knee to help you keep your balance. 2. Roll your hips slightly forward so your hips are stacked directly over each other and your left / right knee is facing forward. 3. Leading with your heel, lift your top leg 4-6 inches (10-15 cm). You should feel the muscles in your outer hip lifting. ? Do not let your foot drift forward. ? Do not let your knee roll toward the ceiling. 4. Hold this  position for 10-15 seconds. 5. Slowly return your leg to the starting position. 6. Let your muscles relax completely after each repetition. Repeat 1-2 times. Complete this exercise every other. Exercise J: Straight Leg Raises - Hip Extensors  1. Lie on your abdomen on a firm surface. You can put a pillow under your hips if that is more comfortable. 2. Tense the muscles in your buttocks and lift your left / right leg about 4-6 inches (10-15 cm). Keep your knee straight as you lift your leg. 3. Hold this position for 10-15 seconds. 4. Slowly lower your leg to the starting position. 5. Let your leg relax completely after each repetition. Repeat 2 times. Complete this exercise every other day. This information is not intended to replace advice given to you by your health care provider. Make sure you discuss any questions you have with your health care provider. Document Released: 11/06/2004 Document Revised: 09/17/2015 Document Reviewed: 10/29/2014 Elsevier Interactive Patient Education  2017 [place].  Ice/cold pack over area for 10-15 min every 2-3 hours while awake.  OK to use IcyHot/topical menthol, but I don't have great data for efficacy.   OK to take Tylenol 1000 mg (2 extra strength tabs) or 975 mg (3 regular strength tabs) every 6 hours as needed.

## 2016-09-19 NOTE — Progress Notes (Signed)
Musculoskeletal Exam  Patient: Kayla Jacobson DOB: [birth year]  DOS: 09/19/2016  SUBJECTIVE:  Chief Complaint:   Chief Complaint  Patient presents with  . Knee Pain    left    [name] is a 41 y.o.  female for evaluation and treatment of L knee pain.   Onset:  2 weeks ago. No injury or change in actiity  Location: L knee Character:  sharp  Progression of issue:  is unchanged Associated symptoms: numbness down lateral leg, foot drop Treatment: to date has been [employer], Excedrin.   Neurovascular symptoms: +weakness of foot, numbness  ROS: Musculoskeletal/Extremities: +L knee pain  Past Medical History:  Diagnosis Date  . Anxiety   . Arthritis   . Chiari malformation   . Depression    bipolar  . Dysrhythmia   . Fibromyalgia   . Headache    migraines  . Seasonal asthma    Past Surgical History:  Procedure Laterality Date  . ABLATION    . CARPAL TUNNEL RELEASE     bilateral  . CHOLECYSTECTOMY    . HYSTEROSCOPY W/ ENDOMETRIAL ABLATION    . LUMBAR LAMINECTOMY/DECOMPRESSION MICRODISCECTOMY Left 11/21/2015   Procedure: CENTRAL LUMBAR DECOMPRESSION MICRODISCECTOMY L4-L5, MICRODISECTOMY L4-L5 LEFT; FORAMINOTOMY L4 AND L5 ROOT LEFT;  Surgeon: Latanya Maudlin, MD;  Location: [facility];  Service: Orthopedics;  Laterality: Left;  . TUBAL LIGATION    . WISDOM TOOTH EXTRACTION     Family History  Problem Relation Age of Onset  . Uterine cancer Mother   . Breast cancer Unknown        grandmother  . Diabetes Father    Current Outpatient Prescriptions  Medication Sig Dispense Refill  . amphetamine-dextroamphetamine (ADDERALL) 20 MG tablet Take 20 mg by mouth daily.     . ASHWAGANDHA PO Take 800 mg by mouth daily.    Marland Kitchen aspirin-acetaminophen-caffeine (EXCEDRIN MIGRAINE) 250-250-65 MG tablet Take 2 tablets by mouth every 6 (six) hours as needed for headache.    . butalbital-acetaminophen-caffeine (FIORICET, ESGIC) 50-325-40 MG tablet Take 1 tablet by mouth 2 (two)  times daily as needed for headache.    Marland Kitchen FLUoxetine (PROZAC) 20 MG capsule Take 20 mg by mouth daily.    Marland Kitchen lisdexamfetamine (VYVANSE) 70 MG capsule Take 70 mg by mouth daily.     Allergies  Allergen Reactions  . Other Anaphylaxis    WALNUTS---THROAT CLOSES UP  . Advair Diskus [Fluticasone-Salmeterol] Hives   Social History   Social History  . Marital status: Married   Social History Main Topics  . Smoking status: Former Smoker    Types: E-cigarettes  . Smokeless tobacco: Never Used  . Alcohol use Yes     Comment: occasional-states 2 month  . Drug use: Unknown    Objective: VITAL SIGNS: BP 127/86 (BP Location: Right Arm, Patient Position: Sitting, Cuff Size: Normal)   Pulse 76   Temp 99.1 F (37.3 C) (Oral)   Ht '5\' 7"'$  (1.702 m)   Wt 248 lb (112.5 kg)   SpO2 98%   BMI 38.84 kg/m  Constitutional: Well formed, well developed. No acute distress. Cardiovascular: Brisk cap refill Thorax & Lungs: No accessory muscle use Extremities: No clubbing. No cyanosis. No edema.  Skin: Warm. Dry. No erythema. No rash.  Musculoskeletal: L knee.   Normal active range of motion: slightly decreased flexion.   Normal passive range of motion: slightly decreased flexion Tenderness to palpation: yes Deformity: no Ecchymosis: no Small effusion noted on L compared to R Tests  positive: None Tests negative: Patellar apprehension, patellar grind, Lachman's, McMurray's, varus/valgus, posterior drawer Neurologic: decreased sensation over lateral L leg Psychiatric: Normal mood. Age appropriate judgment and insight. Alert & oriented x 3.    PROCEDURE NOTE After discussing the procedure and risks, including but not limited to increased pain or stiffness and rarely nausea or dizziness, verbal consent was obtained.  Pre-procedure diagnosis: Somatic dysfunction Post-procedure diagnosis: Same Procedure: OMT  Regions treated include fibular head (posterior):  MET with the tissue response noted to be  Improved. Had some return of sensation over lateral leg.  The patient tolerated the procedure well, and there were no complications noted.  The patient was warned of the possibility of increased pain or stiffness of up to 48 hours duration and was asked to call with any unexpected problems.   Assessment:  Acute pain of left knee - Plan: DISCONTINUED: naproxen (EC NAPROSYN) 500 MG EC tablet  Somatic dysfunction of lower extremity  Plan: Orders as above. Ice. Stretches/exercises. I am planning on the compression against nerve improving as swelling does. We can repeat tx for fibular head if need be also. If this worsens or fails to improve, will consult ortho.  F/u prn.  The patient voiced understanding and agreement to the plan.   [place], DO 09/19/16  11:00 AM

## 2016-09-25 MED FILL — VYVANSE 70 MG CAPSULE: 70 | 30 days supply | Qty: 30 | Fill #0

## 2016-10-06 ENCOUNTER — Ambulatory Visit (HOSPITAL_BASED_OUTPATIENT_CLINIC_OR_DEPARTMENT_OTHER)
Admission: RE | Admit: 2016-10-06 | Discharge: 2016-10-06 | Disposition: A | Discharge status: Home / Self Care | Payer: 59 | Source: Ambulatory Visit | Attending: Family Medicine | Admitting: Family Medicine

## 2016-10-06 ENCOUNTER — Encounter: Payer: Self-pay | Admitting: Family Medicine

## 2016-10-06 ENCOUNTER — Ambulatory Visit (INDEPENDENT_AMBULATORY_CARE_PROVIDER_SITE_OTHER): Payer: 59 | Admitting: Family Medicine

## 2016-10-06 VITALS — BP 142/100 | HR 81 | Temp 98.4°F | Ht 67.0 in | Wt 248.0 lb

## 2016-10-06 DIAGNOSIS — R030 Elevated blood-pressure reading, without diagnosis of hypertension: Secondary | ICD-10-CM

## 2016-10-06 DIAGNOSIS — M25511 Pain in right shoulder: Secondary | ICD-10-CM | POA: Insufficient documentation

## 2016-10-06 MED ORDER — METHYLPREDNISOLONE ACETATE 40 MG/ML IJ SUSP
40.0000 mg | Freq: Once | INTRAMUSCULAR | Status: AC
  Administered 2016-10-06: 40 mg via INTRAMUSCULAR

## 2016-10-06 NOTE — Addendum Note (Signed)
Addended by: Sharon Seller B on: 10/06/2016 08:34 AM   Modules accepted: Orders

## 2016-10-06 NOTE — Patient Instructions (Addendum)
Schedule appt for X-ray at your convenience.  Let me know if things are not improving. The next steps are physical therapy and potentially MRI if those are not helpful.   EXERCISES  RANGE OF MOTION (ROM) AND STRETCHING EXERCISES These exercises may help you when beginning to rehabilitate your injury. Your symptoms may resolve with or without further involvement from your physician, physical therapist or athletic trainer. While completing these exercises, remember:   Restoring tissue flexibility helps normal motion to return to the joints. This allows healthier, less painful movement and activity.  An effective stretch should be held for at least 30 seconds.  A stretch should never be painful. You should only feel a gentle lengthening or release in the stretched tissue.  ROM - Pendulum  Bend at the waist so that your right / left arm falls away from your body. Support yourself with your opposite hand on a solid surface, such as a table or a countertop.  Your right / left arm should be perpendicular to the ground. If it is not perpendicular, you need to lean over farther. Relax the muscles in your right / left arm and shoulder as much as possible.  Gently sway your hips and trunk so they move your right / left arm without any use of your right / left shoulder muscles.  Progress your movements so that your right / left arm moves side to side, then forward and backward, and finally, both clockwise and counterclockwise.  Complete 10-15 repetitions in each direction. Many people use this exercise to relieve discomfort in their shoulder as well as to gain range of motion. Repeat 2-3 times. Complete this exercise once per day.  STRETCH - Flexion, Standing  Stand with good posture. With an underhand grip on your right / left hand and an overhand grip on the opposite hand, grasp a broomstick or cane so that your hands are a little more than shoulder-width apart.  Keeping your right / left elbow  straight and shoulder muscles relaxed, push the stick with your opposite hand to raise your right / left arm in front of your body and then overhead. Raise your arm until you feel a stretch in your right / left shoulder, but before you have increased shoulder pain.  Try to avoid shrugging your right / left shoulder as your arm rises by keeping your shoulder blade tucked down and toward your mid-back spine. Hold 15-20 seconds.  Slowly return to the starting position. Repeat 2-3 times. Complete this exercise once per day.  STRETCH - Internal Rotation  Place your right / left hand behind your back, palm-up.  Throw a towel or belt over your opposite shoulder. Grasp the towel/belt with your right / left hand.  While keeping an upright posture, gently pull up on the towel/belt until you feel a stretch in the front of your right / left shoulder.  Avoid shrugging your right / left shoulder as your arm rises by keeping your shoulder blade tucked down and toward your mid-back spine.  Hold 15-20. Release the stretch by lowering your opposite hand. Repeat 2-3 times. Complete this exercise once per day.  STRETCH - External Rotation and Abduction  Stagger your stance through a doorframe. It does not matter which foot is forward.  As instructed by your physician, physical therapist or athletic trainer, place your hands: ? And forearms above your head and on the door frame. ? And forearms at head-height and on the door frame. ? At elbow-height and on  the door frame.  Keeping your head and chest upright and your stomach muscles tight to prevent over-extending your low-back, slowly shift your weight onto your front foot until you feel a stretch across your chest and/or in the front of your shoulders.  Hold 15-20 seconds. Shift your weight to your back foot to release the stretch. Repeat 2-3 times. Complete this stretch once per day.   STRENGTHENING EXERCISES  These exercises may help you when  beginning to rehabilitate your injury. They may resolve your symptoms with or without further involvement from your physician, physical therapist or athletic trainer. While completing these exercises, remember:   Muscles can gain both the endurance and the strength needed for everyday activities through controlled exercises.  Complete these exercises as instructed by your physician, physical therapist or athletic trainer. Progress the resistance and repetitions only as guided.  You may experience muscle soreness or fatigue, but the pain or discomfort you are trying to eliminate should never worsen during these exercises. If this pain does worsen, stop and make certain you are following the directions exactly. If the pain is still present after adjustments, discontinue the exercise until you can discuss the trouble with your clinician.  If advised by your physician, during your recovery, avoid activity or exercises which involve actions that place your right / left hand or elbow above your head or behind your back or head. These positions stress the tissues which are trying to heal.  STRENGTH - Scapular Depression and Adduction  With good posture, sit on a firm chair. Supported your arms in front of you with pillows, arm rests or a table top. Have your elbows in line with the sides of your body.  Gently draw your shoulder blades down and toward your mid-back spine. Gradually increase the tension without tensing the muscles along the top of your shoulders and the back of your neck.  Hold for 5 seconds. Slowly release the tension and relax your muscles completely before completing the next repetition.  After you have practiced this exercise, remove the arm support and complete it in standing as well as sitting. Repeat 2 times. Complete this exercise every other day.   STRENGTH - External Rotators  Secure a rubber exercise band/tubing to a fixed object so that it is at the same height as your  right / left elbow when you are standing or sitting on a firm surface.  Stand or sit so that the secured exercise band/tubing is at your side that is not injured.  Bend your elbow 90 degrees. Place a folded towel or small pillow under your right / left arm so that your elbow is a few inches away from your side.  Keeping the tension on the exercise band/tubing, pull it away from your body, as if pivoting on your elbow. Be sure to keep your body steady so that the movement is only coming from your shoulder rotating.  Hold 5 seconds. Release the tension in a controlled manner as you return to the starting position. Repeat 2 times. Complete this exercise every other day.   STRENGTH - Supraspinatus  Stand or sit with good posture. Grasp a 2-3 lb weight or an exercise band/tubing so that your hand is "thumbs-up," like when you shake hands.  Slowly lift your right / left hand from your thigh into the air, traveling about 30 degrees from straight out at your side. Lift your hand to shoulder height or as far as you can without increasing any shoulder pain.  Initially, many people do not lift their hands above shoulder height.  Avoid shrugging your right / left shoulder as your arm rises by keeping your shoulder blade tucked down and toward your mid-back spine.  Hold for 4-5 seconds. Control the descent of your hand as you slowly return to your starting position. Repeat 2 times. Complete this exercise every other day.   STRENGTH - Shoulder Extensors  Secure a rubber exercise band/tubing so that it is at the height of your shoulders when you are either standing or sitting on a firm arm-less chair.  With a thumbs-up grip, grasp an end of the band/tubing in each hand. Straighten your elbows and lift your hands straight in front of you at shoulder height. Step back away from the secured end of band/tubing until it becomes tense.  Squeezing your shoulder blades together, pull your hands down to the sides  of your thighs. Do not allow your hands to go behind you.  Hold for 5 seconds. Slowly ease the tension on the band/tubing as you reverse the directions and return to the starting position. Repeat 2 times. Complete this exercise every other day.   STRENGTH - Scapular Retractors  Secure a rubber exercise band/tubing so that it is at the height of your shoulders when you are either standing or sitting on a firm arm-less chair.  With a palm-down grip, grasp an end of the band/tubing in each hand. Straighten your elbows and lift your hands straight in front of you at shoulder height. Step back away from the secured end of band/tubing until it becomes tense.  Squeezing your shoulder blades together, draw your elbows back as you bend them. Keep your upper arm lifted away from your body throughout the exercise.  Hold 5 seconds. Slowly ease the tension on the band/tubing as you reverse the directions and return to the starting position. Repeat 2 times. Complete this exercise every other day.  STRENGTH - Scapular Depressors  Find a sturdy chair without wheels, such as a from a dining room table.  Keeping your feet on the floor, lift your bottom from the seat and lock your elbows.  Keeping your elbows straight, allow gravity to pull your body weight down. Your shoulders will rise toward your ears.  Raise your body against gravity by drawing your shoulder blades down your back, shortening the distance between your shoulders and ears. Although your feet should always maintain contact with the floor, your feet should progressively support less body weight as you get stronger.  Hold 5 seconds. In a controlled and slow manner, lower your body weight to begin the next repetition. Repeat 2 times. Complete this exercise every other day.   This information is not intended to replace advice given to you by your health care provider. Make sure you discuss any questions you have with your health care  provider.  Document Released: 11/06/2004 Document Revised: 01/13/2014 Document Reviewed: 04/06/2008 Elsevier Interactive Patient Education [employer].

## 2016-10-06 NOTE — Progress Notes (Signed)
Musculoskeletal Exam  Patient: Kayla Jacobson DOB: 03-11-1975  DOS: 10/06/2016  SUBJECTIVE:  Chief Complaint:   Chief Complaint  Patient presents with  . Shoulder Pain    right    TEONNA COONAN is a 41 y.o.  female for evaluation and treatment of R shoulder pain.   Onset:  1 month ago. Has had chronic issues with this. Mom dislocated her shoulder when she was a child.  She does shovel manure and thinks it could be related to this.   Location: Right in middle Character:  aching and sharp, more painful when she rolls over on it Progression of issue:  Steadily worsening Associated symptoms: decreased ROM Treatment: to date has been Biofreeze.   Neurovascular symptoms: no  ROS: Musculoskeletal/Extremities: +R shoulder pain Neurologic: no numbness, tingling no weakness   Past Medical History:  Diagnosis Date  . Anxiety   . Arthritis   . Chiari malformation   . Depression    bipolar  . Dysrhythmia   . Fibromyalgia   . Headache    migraines  . Seasonal asthma    Past Surgical History:  Procedure Laterality Date  . ABLATION    . CARPAL TUNNEL RELEASE     bilateral  . CHOLECYSTECTOMY    . HYSTEROSCOPY W/ ENDOMETRIAL ABLATION    . LUMBAR LAMINECTOMY/DECOMPRESSION MICRODISCECTOMY Left 11/21/2015   Procedure: CENTRAL LUMBAR DECOMPRESSION MICRODISCECTOMY L4-L5, MICRODISECTOMY L4-L5 LEFT; FORAMINOTOMY L4 AND L5 ROOT LEFT;  Surgeon: Latanya Maudlin, MD;  Location: [facility];  Service: Orthopedics;  Laterality: Left;  . TUBAL LIGATION    . WISDOM TOOTH EXTRACTION     Family History  Problem Relation Age of Onset  . Uterine cancer Mother   . Breast cancer Unknown        grandmother  . Diabetes Father    Current Outpatient Prescriptions  Medication Sig Dispense Refill  . amphetamine-dextroamphetamine (ADDERALL) 20 MG tablet Take 20 mg by mouth daily.     . ASHWAGANDHA PO Take 800 mg by mouth daily.    Marland Kitchen aspirin-acetaminophen-caffeine (EXCEDRIN MIGRAINE)  250-250-65 MG tablet Take 2 tablets by mouth every 6 (six) hours as needed for headache.    . butalbital-acetaminophen-caffeine (FIORICET, ESGIC) 50-325-40 MG tablet Take 1 tablet by mouth 2 (two) times daily as needed for headache.    Marland Kitchen FLUoxetine (PROZAC) 20 MG capsule Take 20 mg by mouth daily.    Marland Kitchen lisdexamfetamine (VYVANSE) 70 MG capsule Take 70 mg by mouth daily.    . naproxen (EC NAPROSYN) 500 MG EC tablet Take 1 tablet (500 mg total) by mouth 2 (two) times daily with a meal. 30 tablet 0   Allergies  Allergen Reactions  . Other Anaphylaxis    WALNUTS---THROAT CLOSES UP  . Advair Diskus [Fluticasone-Salmeterol] Hives   Social History   Social History  . Marital status: Married   Social History Main Topics  . Smoking status: Former Smoker    Types: E-cigarettes  . Smokeless tobacco: Never Used  . Alcohol use Yes     Comment: occasional-states 2 month   Objective: VITAL SIGNS: BP (!) 142/100 (BP Location: Right Arm, Patient Position: Sitting, Cuff Size: Large)   Pulse 81   Temp 98.4 F (36.9 C) (Oral)   Ht 5\' 7"  (1.702 m)   Wt 248 lb (112.5 kg)   SpO2 98%   BMI 38.84 kg/m  Constitutional: Well formed, well developed. No acute distress. Thorax & Lungs: No accessory muscle use Extremities: No clubbing. No cyanosis. No edema.  Skin: Warm. Dry. No erythema. No rash.  Musculoskeletal: R shoulder.   Normal active range of motion: no.   Normal passive range of motion: no- decreased forward flexion Tenderness to palpation: no Deformity: no Ecchymosis: no Tests positive: Neer's, Hawkin's, Empty can Tests negative: Cross over, Crossover O'Brien's, Obrien's, Liftoff, Speeds, Spurling's Neurologic: Normal sensory function. No focal deficits noted. DTR's equal and symmetry in UE's. No clonus. Psychiatric: Normal mood. Age appropriate judgment and insight. Alert & oriented x 3.    Procedure Note; Shoulder joint injection, R Informed consent obtained. The area was palpated, an  area was marked infero-lateral to acromion and slightly posterior and cleaned with alcohol x1. Freeze spray was used over area. A 27-gauge needle, while aiming towards the coracoid process, was used to enter the joint posteriorally with ease. 40 mg of Depomedrol with 2 mL of 1% lidocaine wo Epi was injected. A bandaid was placed.  The patient tolerated the procedure well. There were no complications noted.   Assessment:  Right shoulder pain, unspecified chronicity - Plan: DG Shoulder Right  Elevated blood pressure reading  Plan: Orders as above. Injection today. Home stretches and exercises given. Continue home regimen. If no improvement, will consider physical therapy. X-ray to rule out impingement. If physical therapy is not particularly helpful, will obtain MRI. I am somewhat concerned this recurring issue has been going on for decades. Hopefully, she only needs strengthening of her muscles to better stabilize her shoulder.  She states that her blood pressure is elevated whenever she is in pain. She works in a [employer] and I will ask her recheck when she is not in pain. Let us know if it is still elevated. F/u if symptoms fail to improve.  The patient voiced understanding and agreement to the plan.   [place], DO 10/06/16  8:09 AM

## 2016-10-06 NOTE — Progress Notes (Signed)
Pre visit review using our clinic review tool, if applicable. No additional management support is needed unless otherwise documented below in the visit note. 

## 2016-10-07 ENCOUNTER — Telehealth: Payer: Self-pay | Admitting: Family Medicine

## 2016-10-07 ENCOUNTER — Other Ambulatory Visit: Payer: 59

## 2016-10-07 DIAGNOSIS — R300 Dysuria: Secondary | ICD-10-CM

## 2016-10-07 MED ORDER — SULFAMETHOXAZOLE-TRIMETHOPRIM 800-160 MG PO TABS: 1.0000 | ORAL_TABLET | Freq: Two times a day (BID) | ORAL | 0 refills | Status: DC

## 2016-10-07 MED ORDER — PHENAZOPYRIDINE HCL 200 MG PO TABS: 200.0000 mg | ORAL_TABLET | Freq: Three times a day (TID) | ORAL | 0 refills | Status: DC | PRN

## 2016-10-07 MED FILL — PHENAZOPYRIDINE 200 MG TAB: 200 | 3 days supply | Qty: 6 | Fill #0

## 2016-10-07 MED FILL — SULFAMETHOXAZOLE/TMP DS TAB: 800-160 | 5 days supply | Qty: 10 | Fill #0

## 2016-10-07 NOTE — Telephone Encounter (Signed)
Should proceed with culture of urine and urine dip.

## 2016-10-07 NOTE — Telephone Encounter (Signed)
Patient called stating that she was having pain when she urinates. She also states she has a lot of pressure whe she goes to the restroom. Patient would like a culture and something to relieve the pain while the culture is being sent out.  Orders already placed  Please advise

## 2016-10-09 LAB — URINE CULTURE
MICRO NUMBER:: 81092280
SPECIMEN QUALITY: ADEQUATE

## 2016-10-10 NOTE — Telephone Encounter (Signed)
Done. PC

## 2016-10-14 ENCOUNTER — Other Ambulatory Visit: Payer: Self-pay

## 2016-10-14 DIAGNOSIS — N390 Urinary tract infection, site not specified: Secondary | ICD-10-CM

## 2016-10-14 MED ORDER — NITROFURANTOIN MONOHYD MACRO 100 MG PO CAPS: 100.0000 mg | ORAL_CAPSULE | Freq: Two times a day (BID) | ORAL | 0 refills | Status: DC

## 2016-10-14 MED FILL — NITROFURANTOIN MONO-MCR 100: 100 | 5 days supply | Qty: 10 | Fill #0

## 2016-10-28 ENCOUNTER — Ambulatory Visit (INDEPENDENT_AMBULATORY_CARE_PROVIDER_SITE_OTHER): Payer: 59 | Admitting: Family Medicine

## 2016-10-28 ENCOUNTER — Encounter: Payer: Self-pay | Admitting: Family Medicine

## 2016-10-28 DIAGNOSIS — M5126 Other intervertebral disc displacement, lumbar region: Secondary | ICD-10-CM | POA: Diagnosis not present

## 2016-10-28 MED ORDER — PREDNISONE 20 MG PO TABS: ORAL_TABLET | ORAL | 0 refills | Status: DC

## 2016-10-28 NOTE — Patient Instructions (Signed)
Take prednisone with food and update [place] if not better.  If new weakness, then go to the ER.  Take care.  Glad to see you.

## 2016-10-28 NOTE — Progress Notes (Signed)
MRI prior to surgery:  Progressive disc degeneration at L4-5. Progression of central and left-sided disc protrusion at L4-5, now with impingement of the left L5 nerve root in the subarticular zone.  S/p laminectomy in 11/2016.  She was improved after surgery but has occ flares of pain.  She went hiking his past weekend and then afterward had to do a bunch of yard work.  Pain started in lower back, radiating into the R>L leg, to the R ankle, mid calf on the L leg.  Pain is coming and going, waxing and waning.  Worse positionally, if leaning while standing.  No trauma.  She has h/o intermittent L foot weakness, with a drop noted during flares of pain.   No FCNAVD.  No dysuria.  No saddle anesthesia.    Last flare was a few months ago.  Has been on prednisone prev, but not in last few weeks.  She gets irritable with the pred but is able to tolerate it.    She has been up since 2 AM due to pain.  Taking excedrin.    Last saw Dr. Gladstone Lighter a few months ago. Prev tried on gabapentin but it affected her motivation.   She noted a click in her L hip with walking.   Meds, vitals, and allergies reviewed.   ROS: Per HPI unless specifically indicated in ROS section   nad but uncomfortable Neck supple, normal sensation in the BUE rrr ctab Back w/o midline pian More pain on the R lower back, more tender in the R lower back compared to L side.   Patch of altered but not absent sensation on the L lateral shin noted.   Weaker L hip flexion compared to R leg Able to bear weight.

## 2016-10-30 NOTE — Assessment & Plan Note (Addendum)
S/p surgery, with flare of sciatica.  She has history of weakness intermittently noted during flares of pain, so her symptoms are not entirely new.  Would restart prednisone.  Steroid caution d/w pt.  It may be reasonable for patient to see ortho again and/or see Dr. Nelva Bush.  She may be a candidate for lyrica in the future, but that wouldn't address any structural problems. Discussed with patient. At this point still okay for outpatient follow-up. She agrees with plan. She will update me if worse.  Addendum. I talked to patient the next day. Her pain level was some better in the morning, but had increase some as the work day went on. I asked her to update me if she did not continue to make improvement.

## 2016-11-13 ENCOUNTER — Ambulatory Visit (INDEPENDENT_AMBULATORY_CARE_PROVIDER_SITE_OTHER): Payer: 59 | Admitting: Family Medicine

## 2016-11-13 ENCOUNTER — Telehealth: Payer: Self-pay | Admitting: Family Medicine

## 2016-11-13 ENCOUNTER — Encounter: Payer: Self-pay | Admitting: Family Medicine

## 2016-11-13 VITALS — BP 136/82 | HR 70 | Temp 98.0°F | Ht 67.0 in | Wt 250.8 lb

## 2016-11-13 DIAGNOSIS — M5126 Other intervertebral disc displacement, lumbar region: Secondary | ICD-10-CM

## 2016-11-13 MED ORDER — PREGABALIN 75 MG PO CAPS: 75.0000 mg | ORAL_CAPSULE | Freq: Two times a day (BID) | ORAL | 3 refills | Status: DC

## 2016-11-13 NOTE — Telephone Encounter (Signed)
Copied from [place] [id number]. Topic: Quick Communication - See Telephone Encounter >> Nov 13, 2016 11:10 AM Ahmed Prima L wrote: CRM for notification. See Telephone encounter for:   [facility] from [facility] [address] and said a script for [employer] was sent to their pharmacy and it was [facility] [place]. Can someone please re send the script to the appropriate office. Thank you! 11/13/16.

## 2016-11-13 NOTE — Assessment & Plan Note (Signed)
Discussed tx options with pt- agreed to trial of lyrica. Lyrica 75 mg twice daily rx printed and given to pt. Follow up in 3-4 weeks. The patient indicates understanding of these issues and agrees with the plan.

## 2016-11-13 NOTE — Telephone Encounter (Signed)
This was sent to the wrong pharmacy. Thanks

## 2016-11-13 NOTE — Telephone Encounter (Signed)
Pt advised how to proceed per [facility]

## 2016-11-13 NOTE — Progress Notes (Signed)
Subjective:   Patient ID: [name], female    DOB: 27-May-1975, 41 y.o.   MRN: [id number]  ANISAH KUCK is a pleasant 41 y.o. year old female who presents to clinic today with No chief complaint on file.  on 11/13/2016  HPI:  Lumbar back pain with radiculopathy-  MRI prior to surgery (09/05/15):  Progressive disc degeneration at L4-5. Progression of central and left-sided disc protrusion at L4-5, now with impingement of the left L5 nerve root in the subarticular zone.  S/p laminectomy in 11/2015.  She was improved after surgery- foot drop resolved, but still in constant intermittent pain.  Has tried gabapentin which helped but it made her feel like she was "slow motion."  Bursts of prednisone help but only for short periods of time Current Outpatient Medications on File Prior to Visit  Medication Sig Dispense Refill  . ASHWAGANDHA PO Take 800 mg by mouth daily.    Marland Kitchen aspirin-acetaminophen-caffeine (EXCEDRIN MIGRAINE) 250-250-65 MG tablet Take 2 tablets by mouth every 6 (six) hours as needed for headache.    . Biotin 5000 MCG TABS Take 1 tablet daily by mouth.    . Cholecalciferol 5000 units TABS Take 1 tablet daily by mouth.    Marland Kitchen FLUoxetine (PROZAC) 20 MG capsule Take 20 mg by mouth daily.    Marland Kitchen GLUCOSAMINE-CHONDROITIN PO Take 2 tablets daily by mouth.    . lisdexamfetamine (VYVANSE) 70 MG capsule Take 70 mg by mouth daily.    . TURMERIC PO Take 1 tablet daily by mouth.     No current facility-administered medications on file prior to visit.     Allergies  Allergen Reactions  . Other Anaphylaxis    WALNUTS---THROAT CLOSES UP  . Advair Diskus [Fluticasone-Salmeterol] Hives  . Gabapentin Other (See Comments)    it affected her motivation.     Past Medical History:  Diagnosis Date  . Anxiety   . Arthritis   . Chiari malformation   . Depression    bipolar  . Dysrhythmia   . Fibromyalgia   . Headache    migraines  . Seasonal asthma     Past Surgical  History:  Procedure Laterality Date  . ABLATION    . CARPAL TUNNEL RELEASE     bilateral  . CHOLECYSTECTOMY    . HYSTEROSCOPY W/ ENDOMETRIAL ABLATION    . TUBAL LIGATION    . WISDOM TOOTH EXTRACTION      Family History  Problem Relation Age of Onset  . Uterine cancer Mother   . Breast cancer Unknown        grandmother  . Diabetes Father     Social History   Socioeconomic History  . Marital status: Married    Spouse name: Not on file  . Number of children: Not on file  . Years of education: Not on file  . Highest education level: Not on file  Social Needs  . Financial resource strain: Not on file  . Food insecurity - worry: Not on file  . Food insecurity - inability: Not on file  . Transportation needs - medical: Not on file  . Transportation needs - non-medical: Not on file  Occupational History  . Not on file  Tobacco Use  . Smoking status: Former Smoker    Types: E-cigarettes  . Smokeless tobacco: Never Used  Substance and Sexual Activity  . Alcohol use: Yes    Comment: occasional-states 2 month  . Drug use: Not on file  . Sexual  activity: Not on file  Other Topics Concern  . Not on file  Social History Narrative  . Not on file   The PMH, PSH, Social History, Family History, Medications, and allergies have been reviewed in [facility], and have been updated if relevant.   Review of Systems  Genitourinary: Negative.   Musculoskeletal: Positive for back pain.  Neurological: Positive for numbness.  All other systems reviewed and are negative.      Objective:    BP 136/82   Pulse 70   Temp 98 F (36.7 C) (Oral)   Ht 5\' 7"  (1.702 m)   Wt 250 lb 12 oz (113.7 kg)   LMP 10/26/2016   BMI 39.27 kg/m    Physical Exam  Constitutional: She is oriented to person, place, and time. She appears well-developed and well-nourished. No distress.  HENT:  Head: Normocephalic and atraumatic.  Eyes: Conjunctivae are normal.  Cardiovascular: Normal rate.    Pulmonary/Chest: Effort normal.  Musculoskeletal:  + ataxic gait  Neurological: She is alert and oriented to person, place, and time. No cranial nerve deficit.  Skin: Skin is warm and dry. She is not diaphoretic.  Psychiatric: She has a normal mood and affect. Her behavior is normal. Judgment and thought content normal.  Nursing note and vitals reviewed.         Assessment & Plan:   Herniated intervertebral disc of lumbar spine No Follow-up on file.

## 2016-12-01 ENCOUNTER — Other Ambulatory Visit: Payer: Self-pay | Admitting: Family Medicine

## 2016-12-01 MED ORDER — LISDEXAMFETAMINE DIMESYLATE 70 MG PO CAPS
70.0000 mg | ORAL_CAPSULE | Freq: Every day | ORAL | 0 refills | Status: DC
Start: 2016-12-01 — End: 2016-12-18

## 2016-12-09 ENCOUNTER — Other Ambulatory Visit: Payer: Self-pay | Admitting: Family Medicine

## 2016-12-09 MED ORDER — PREGABALIN 100 MG PO CAPS: 100.0000 mg | ORAL_CAPSULE | Freq: Two times a day (BID) | ORAL | 1 refills | Status: DC

## 2016-12-18 ENCOUNTER — Other Ambulatory Visit: Payer: Self-pay | Admitting: Family Medicine

## 2016-12-18 MED ORDER — LISDEXAMFETAMINE DIMESYLATE 70 MG PO CAPS: 70.0000 mg | ORAL_CAPSULE | Freq: Every day | ORAL | 0 refills | Status: DC

## 2017-01-07 ENCOUNTER — Other Ambulatory Visit: Payer: 59

## 2017-01-07 ENCOUNTER — Ambulatory Visit: Payer: 59 | Admitting: Family Medicine

## 2017-01-07 ENCOUNTER — Telehealth: Payer: Self-pay | Admitting: Family Medicine

## 2017-01-07 DIAGNOSIS — N390 Urinary tract infection, site not specified: Secondary | ICD-10-CM | POA: Diagnosis not present

## 2017-01-07 MED ORDER — CIPROFLOXACIN HCL 500 MG PO TABS: 500.0000 mg | ORAL_TABLET | Freq: Two times a day (BID) | ORAL | 0 refills | Status: DC

## 2017-01-07 NOTE — Telephone Encounter (Signed)
Patient with UTI symptoms,  Dipped urine and UA shoed 3+ LE, 3+ blood.  Will treat with cipro, send urine for cx.

## 2017-01-09 ENCOUNTER — Other Ambulatory Visit: Payer: Self-pay | Admitting: Family Medicine

## 2017-01-09 LAB — URINE CULTURE
MICRO NUMBER: 90003269
SPECIMEN QUALITY:: ADEQUATE

## 2017-01-09 MED ORDER — CIPROFLOXACIN HCL 500 MG PO TABS: 500.0000 mg | ORAL_TABLET | Freq: Two times a day (BID) | ORAL | 0 refills | Status: DC

## 2017-01-12 ENCOUNTER — Ambulatory Visit: Payer: 59 | Admitting: Family Medicine

## 2017-01-28 ENCOUNTER — Other Ambulatory Visit: Payer: Self-pay | Admitting: Family Medicine

## 2017-01-28 MED ORDER — LISDEXAMFETAMINE DIMESYLATE 70 MG PO CAPS: 70.0000 mg | ORAL_CAPSULE | Freq: Every day | ORAL | 0 refills | Status: DC

## 2017-01-30 MED FILL — VYVANSE 70 MG CAPSULE: 70 | 30 days supply | Qty: 30 | Fill #0

## 2017-02-05 ENCOUNTER — Telehealth: Payer: Self-pay | Admitting: Family Medicine

## 2017-02-05 ENCOUNTER — Other Ambulatory Visit: Payer: Self-pay | Admitting: Family Medicine

## 2017-02-05 ENCOUNTER — Encounter: Payer: Self-pay | Admitting: Family Medicine

## 2017-02-05 ENCOUNTER — Ambulatory Visit (INDEPENDENT_AMBULATORY_CARE_PROVIDER_SITE_OTHER): Payer: 59 | Admitting: Family Medicine

## 2017-02-05 ENCOUNTER — Other Ambulatory Visit (HOSPITAL_COMMUNITY)
Admission: RE | Admit: 2017-02-05 | Discharge: 2017-02-05 | Disposition: A | Discharge status: Home / Self Care | Payer: 59 | Source: Ambulatory Visit | Attending: Family Medicine | Admitting: Family Medicine

## 2017-02-05 VITALS — BP 126/82 | HR 75 | Temp 98.8°F | Ht 65.5 in | Wt 242.4 lb

## 2017-02-05 DIAGNOSIS — Z01419 Encounter for gynecological examination (general) (routine) without abnormal findings: Secondary | ICD-10-CM | POA: Insufficient documentation

## 2017-02-05 DIAGNOSIS — Z0000 Encounter for general adult medical examination without abnormal findings: Secondary | ICD-10-CM | POA: Diagnosis not present

## 2017-02-05 DIAGNOSIS — Z1239 Encounter for other screening for malignant neoplasm of breast: Secondary | ICD-10-CM

## 2017-02-05 DIAGNOSIS — Z1231 Encounter for screening mammogram for malignant neoplasm of breast: Secondary | ICD-10-CM

## 2017-02-05 DIAGNOSIS — Z114 Encounter for screening for human immunodeficiency virus [HIV]: Secondary | ICD-10-CM | POA: Diagnosis not present

## 2017-02-05 MED ORDER — HYDROCODONE-HOMATROPINE 5-1.5 MG/5ML PO SYRP: 5.0000 mL | ORAL_SOLUTION | Freq: Three times a day (TID) | ORAL | 0 refills | Status: DC | PRN

## 2017-02-05 MED ORDER — VARENICLINE TARTRATE 0.5 MG X 11 & 1 MG X 42 PO MISC: ORAL | 0 refills | Status: DC

## 2017-02-05 MED ORDER — AMOXICILLIN-POT CLAVULANATE 875-125 MG PO TABS: 1.0000 | ORAL_TABLET | Freq: Two times a day (BID) | ORAL | 0 refills | Status: DC

## 2017-02-05 MED FILL — HYDROCODONE-HOMATROPINE SOL: 5-1.5 | 8 days supply | Qty: 120 | Fill #0

## 2017-02-05 MED FILL — AMOX-CLAV 875-125 MG TABLET: 875-125 | 10 days supply | Qty: 20 | Fill #0

## 2017-02-05 NOTE — Telephone Encounter (Signed)
Should Rx be for 28 tabs instead of 20?

## 2017-02-05 NOTE — Telephone Encounter (Signed)
Pharmacy is aware that Rx should be for 10 days, #20.

## 2017-02-05 NOTE — Telephone Encounter (Signed)
Copied from [place]. Topic: Inquiry >> Feb 05, 2017  9:40 AM Corie Chiquito, [place] wrote: Reason for CRM: Lauralyn Primes called because she needs to know if the patients Augmentin is supposed to be for 14 days or 10. Stated that the order is for only 20 tablets that will only last for the 10 days but the script wants her to take the medication for 14 days. If someone could give them a call back about this at [phone]

## 2017-02-05 NOTE — Assessment & Plan Note (Signed)
Reviewed preventive care protocols, scheduled due services, and updated immunizations Discussed nutrition, exercise, diet, and healthy lifestyle.  Orders Placed This Encounter  Procedures  . MM Digital Screening    Standing Status:   Future    Standing Expiration Date:   04/06/2018    Order Specific Question:   Reason for Exam (SYMPTOM  OR DIAGNOSIS REQUIRED)    Answer:   screening breast cancer    Order Specific Question:   Is the patient pregnant?    Answer:   No    Order Specific Question:   Preferred imaging location?    Answer:   [facility]  . CBC with Differential/Platelet    Standing Status:   Future    Standing Expiration Date:   02/05/2018  . Comprehensive metabolic panel    Standing Status:   Future    Standing Expiration Date:   02/05/2018  . Lipid panel    Standing Status:   Future    Standing Expiration Date:   02/05/2018  . TSH    Standing Status:   Future    Standing Expiration Date:   02/05/2018  . HIV antibody (with reflex)    Standing Status:   Future    Standing Expiration Date:   02/05/2018   Mammogram ordered and phone number for breast center given to pt to schedule her own mammogram.

## 2017-02-05 NOTE — Telephone Encounter (Signed)
Should be for 10 days, #20

## 2017-02-05 NOTE — Progress Notes (Signed)
Subjective:   Patient ID: [name], female    DOB: 1975-06-10, 42 y.o.   MRN: [id number]  TONNI MANSOUR is a pleasant 42 y.o. year old female who presents to clinic today with Annual Exam  on 02/05/2017  HPI:  Unsure when she had her last pap smear. NO h/o abnormal pap smears.  Overdue for mammogram.  Current Outpatient Medications on File Prior to Visit  Medication Sig Dispense Refill  . ASHWAGANDHA PO Take 800 mg by mouth daily.    . Biotin 5000 MCG TABS Take 1 tablet daily by mouth.    . Cholecalciferol 5000 units TABS Take 1 tablet daily by mouth.    Marland Kitchen FLUoxetine (PROZAC) 20 MG capsule Take 20 mg by mouth daily.    Marland Kitchen GLUCOSAMINE-CHONDROITIN PO Take 2 tablets daily by mouth.    . lisdexamfetamine (VYVANSE) 70 MG capsule Take 1 capsule (70 mg total) by mouth daily. 30 capsule 0  . pregabalin (LYRICA) 100 MG capsule Take 1 capsule (100 mg total) by mouth 2 (two) times daily. 60 capsule 1  . TURMERIC PO Take 1 tablet daily by mouth.     No current facility-administered medications on file prior to visit.     Allergies  Allergen Reactions  . Other Anaphylaxis    WALNUTS---THROAT CLOSES UP  . Advair Diskus [Fluticasone-Salmeterol] Hives  . Gabapentin Other (See Comments)    it affected her motivation.     Past Medical History:  Diagnosis Date  . Anxiety   . Arthritis   . Chiari malformation   . Depression    bipolar  . Dysrhythmia   . Fibromyalgia   . Headache    migraines  . Seasonal asthma     Past Surgical History:  Procedure Laterality Date  . ABLATION    . CARPAL TUNNEL RELEASE     bilateral  . CHOLECYSTECTOMY    . HYSTEROSCOPY W/ ENDOMETRIAL ABLATION    . LUMBAR LAMINECTOMY/DECOMPRESSION MICRODISCECTOMY Left 11/21/2015   Procedure: CENTRAL LUMBAR DECOMPRESSION MICRODISCECTOMY L4-L5, MICRODISECTOMY L4-L5 LEFT; FORAMINOTOMY L4 AND L5 ROOT LEFT;  Surgeon: Latanya Maudlin, MD;  Location: [facility];  Service: Orthopedics;  Laterality: Left;  .  TUBAL LIGATION    . WISDOM TOOTH EXTRACTION      Family History  Problem Relation Age of Onset  . Uterine cancer Mother   . Breast cancer Unknown        grandmother  . Diabetes Father     Social History   Socioeconomic History  . Marital status: Married    Spouse name: Not on file  . Number of children: Not on file  . Years of education: Not on file  . Highest education level: Not on file  Social Needs  . Financial resource strain: Not on file  . Food insecurity - worry: Not on file  . Food insecurity - inability: Not on file  . Transportation needs - medical: Not on file  . Transportation needs - non-medical: Not on file  Occupational History  . Not on file  Tobacco Use  . Smoking status: Former Smoker    Types: E-cigarettes  . Smokeless tobacco: Never Used  Substance and Sexual Activity  . Alcohol use: Yes    Comment: occasional-states 2 month  . Drug use: Not on file  . Sexual activity: Not on file  Other Topics Concern  . Not on file  Social History Narrative  . Not on file   The PMH, [place], Social History, Family  History, Medications, and allergies have been reviewed in [facility], and have been updated if relevant.   Review of Systems  Constitutional: Negative.   HENT: Negative.   Eyes: Negative.   Respiratory: Negative.   Cardiovascular: Negative.   Gastrointestinal: Negative.   Endocrine: Negative.   Genitourinary: Negative.   Musculoskeletal: Negative.   Skin: Negative.   Allergic/Immunologic: Negative.   Neurological: Negative.   Hematological: Negative.   Psychiatric/Behavioral: Negative.   All other systems reviewed and are negative.      Objective:    BP 126/82   Pulse 75   Temp 98.8 F (37.1 C)   Ht 5' 5.5" (1.664 m)   Wt 242 lb 6.4 oz (110 kg)   BMI 39.72 kg/m    Physical Exam   General:  Well-developed,well-nourished,in no acute distress; alert,appropriate and cooperative throughout examination Head:  normocephalic and atraumatic.    Eyes:  vision grossly intact, PERRL Ears:  R ear normal and L ear normal externally, TMs clear bilaterally Nose:  no external deformity.   Mouth:  good dentition.   Neck:  No deformities, masses, or tenderness noted. Breasts:  No mass, nodules, thickening, tenderness, bulging, retraction, inflamation, nipple discharge or skin changes noted.   Lungs:  Normal respiratory effort, chest expands symmetrically. Lungs are clear to auscultation, no crackles or wheezes. Heart:  Normal rate and regular rhythm. S1 and S2 normal without gallop, murmur, click, rub or other extra sounds. Abdomen:  Bowel sounds positive,abdomen soft and non-tender without masses, organomegaly or hernias noted. Rectal:  no external abnormalities.   Genitalia:  Pelvic Exam:        External: normal female genitalia without lesions or masses        Vagina: normal without lesions or masses        Cervix: normal without lesions or masses        Adnexa: normal bimanual exam without masses or fullness        Uterus: normal by palpation        Pap smear: performed Msk:  No deformity or scoliosis noted of thoracic or lumbar spine.   Extremities:  No clubbing, cyanosis, edema, or deformity noted with normal full range of motion of all joints.   Neurologic:  alert & oriented X3 and gait normal.   Skin:  Intact without suspicious lesions or rashes Cervical Nodes:  No lymphadenopathy noted Axillary Nodes:  No palpable lymphadenopathy Psych:  Cognition and judgment appear intact. Alert and cooperative with normal attention span and concentration. No apparent delusions, illusions, hallucinations       Assessment & Plan:   Screening for HIV (human immunodeficiency virus) - Plan: HIV antibody (with reflex)  Well woman exam with routine gynecological exam - Plan: CBC with Differential/Platelet, Comprehensive metabolic panel, Lipid panel, TSH, Cytology - PAP  Screening for breast cancer - Plan: MM Digital Screening No Follow-up  on file.

## 2017-02-05 NOTE — Patient Instructions (Signed)
Please call the breast center at [phone] to schedule your mammogram.  

## 2017-02-06 LAB — CYTOLOGY - PAP
Bacterial vaginitis: NEGATIVE
CANDIDA VAGINITIS: NEGATIVE
CHLAMYDIA, DNA PROBE: NEGATIVE
DIAGNOSIS: NEGATIVE
HPV: NOT DETECTED
Neisseria Gonorrhea: NEGATIVE
Trichomonas: NEGATIVE

## 2017-02-06 MED FILL — CHANTIX STARTING MONTH BOX: 0.5 MG X 11 | 28 days supply | Qty: 53 | Fill #0

## 2017-02-09 ENCOUNTER — Other Ambulatory Visit: Payer: Self-pay | Admitting: Family Medicine

## 2017-02-09 DIAGNOSIS — E559 Vitamin D deficiency, unspecified: Secondary | ICD-10-CM

## 2017-02-09 MED ORDER — PREGABALIN 100 MG PO CAPS: 100.0000 mg | ORAL_CAPSULE | Freq: Two times a day (BID) | ORAL | 1 refills | Status: DC

## 2017-02-11 LAB — CERVICOVAGINAL ANCILLARY ONLY: Herpes: NEGATIVE

## 2017-02-16 ENCOUNTER — Other Ambulatory Visit (INDEPENDENT_AMBULATORY_CARE_PROVIDER_SITE_OTHER): Payer: 59

## 2017-02-16 DIAGNOSIS — E559 Vitamin D deficiency, unspecified: Secondary | ICD-10-CM | POA: Diagnosis not present

## 2017-02-16 DIAGNOSIS — Z01419 Encounter for gynecological examination (general) (routine) without abnormal findings: Secondary | ICD-10-CM | POA: Diagnosis not present

## 2017-02-16 LAB — COMPREHENSIVE METABOLIC PANEL
ALBUMIN: 3.7 g/dL (ref 3.5–5.2)
ALK PHOS: 70 U/L (ref 39–117)
ALT: 22 U/L (ref 0–35)
AST: 21 U/L (ref 0–37)
BILIRUBIN TOTAL: 0.3 mg/dL (ref 0.2–1.2)
BUN: 11 mg/dL (ref 6–23)
CALCIUM: 8.5 mg/dL (ref 8.4–10.5)
CO2: 29 meq/L (ref 19–32)
CREATININE: 0.62 mg/dL (ref 0.40–1.20)
Chloride: 104 mEq/L (ref 96–112)
GFR: 112.42 mL/min (ref >60.00)
Glucose, Bld: 97 mg/dL (ref 70–99)
Potassium: 4.3 mEq/L (ref 3.5–5.1)
Sodium: 141 mEq/L (ref 135–145)
TOTAL PROTEIN: 5.8 g/dL — AB (ref 6.0–8.3)

## 2017-02-16 LAB — CBC WITH DIFFERENTIAL/PLATELET
BASOS ABS: 0.1 10*3/uL (ref 0.0–0.1)
Basophils Relative: 1.1 % (ref 0.0–3.0)
EOS ABS: 0.2 10*3/uL (ref 0.0–0.7)
Eosinophils Relative: 4 % (ref 0.0–5.0)
HCT: 39.9 % (ref 36.0–46.0)
Hemoglobin: 13.4 g/dL (ref 12.0–15.0)
LYMPHS PCT: 28.3 % (ref 12.0–46.0)
Lymphs Abs: 1.3 10*3/uL (ref 0.7–4.0)
MCHC: 33.6 g/dL (ref 30.0–36.0)
MCV: 95.7 fl (ref 78.0–100.0)
Monocytes Absolute: 0.3 10*3/uL (ref 0.1–1.0)
Monocytes Relative: 6.7 % (ref 3.0–12.0)
NEUTROS ABS: 2.8 10*3/uL (ref 1.4–7.7)
Neutrophils Relative %: 59.9 % (ref 43.0–77.0)
PLATELETS: 236 10*3/uL (ref 150.0–400.0)
RBC: 4.17 Mil/uL (ref 3.87–5.11)
RDW: 13.5 % (ref 11.5–15.5)
WBC: 4.7 10*3/uL (ref 4.0–10.5)

## 2017-02-16 LAB — LIPID PANEL
CHOLESTEROL: 215 mg/dL — AB (ref 0–200)
HDL: 52.3 mg/dL (ref >39.00)
LDL Cholesterol: 144 mg/dL — ABNORMAL HIGH (ref 0–99)
NonHDL: 163.16
TRIGLYCERIDES: 97 mg/dL (ref 0.0–149.0)
Total CHOL/HDL Ratio: 4
VLDL: 19.4 mg/dL (ref 0.0–40.0)

## 2017-02-16 LAB — TSH: TSH: 1.43 u[IU]/mL (ref 0.35–4.50)

## 2017-02-16 LAB — VITAMIN D 25 HYDROXY (VIT D DEFICIENCY, FRACTURES): VITD: 40.57 ng/mL (ref 30.00–100.00)

## 2017-02-17 ENCOUNTER — Other Ambulatory Visit: Payer: Self-pay | Admitting: Family Medicine

## 2017-02-17 ENCOUNTER — Telehealth: Payer: Self-pay | Admitting: Family Medicine

## 2017-02-17 MED ORDER — AMOXICILLIN-POT CLAVULANATE 875-125 MG PO TABS: 1.0000 | ORAL_TABLET | Freq: Two times a day (BID) | ORAL | 0 refills | Status: AC

## 2017-02-17 MED FILL — AMOX-CLAV 875-125 MG TABLET: 875-125 | 10 days supply | Qty: 20 | Fill #0

## 2017-02-17 NOTE — Telephone Encounter (Signed)
I called and spoke with pharmacy. They are aware Rx should be for 10 days, #20.

## 2017-02-17 NOTE — Telephone Encounter (Signed)
She should take it for 10 days.  Thank you.

## 2017-02-17 NOTE — Telephone Encounter (Signed)
Copied from [place] [id number]. Topic: Quick Communication - See Telephone Encounter >> Feb 17, 2017 10:26 AM Bea Graff, [place] wrote: CRM for notification. See Telephone encounter for: Kayla Jacobson from Kayla Jacobson needing a new script sent over for this pt for the  amoxicillin-clavulanate (AUGMENTIN). The directions state to take 1 tablet twice a day for 14 days but with only a quantity of 20 pills when that would need to be 28 pills. Does Dr. Deborra Jacobson want the pt to take this for 14 days or 10 days? CB#: [phone]  02/17/17.

## 2017-03-02 ENCOUNTER — Other Ambulatory Visit: Payer: Self-pay | Admitting: Family Medicine

## 2017-03-02 MED ORDER — LISDEXAMFETAMINE DIMESYLATE 70 MG PO CAPS: 70.0000 mg | ORAL_CAPSULE | Freq: Every day | ORAL | 0 refills | Status: DC

## 2017-03-02 MED FILL — VYVANSE 70 MG CAPSULE: 70 | 30 days supply | Qty: 30 | Fill #0

## 2017-03-05 ENCOUNTER — Other Ambulatory Visit: Payer: Self-pay | Admitting: Family Medicine

## 2017-03-05 MED ORDER — ONDANSETRON 8 MG PO TBDP
8.0000 mg | ORAL_TABLET | Freq: Three times a day (TID) | ORAL | 0 refills | Status: DC | PRN
Start: 2017-03-05 — End: 2017-03-25

## 2017-03-05 NOTE — Progress Notes (Signed)
zof

## 2017-03-10 ENCOUNTER — Other Ambulatory Visit: Payer: Self-pay | Admitting: Family Medicine

## 2017-03-10 MED ORDER — FLUOXETINE HCL 40 MG PO CAPS: 40.0000 mg | ORAL_CAPSULE | Freq: Every day | ORAL | 3 refills | Status: DC

## 2017-03-24 ENCOUNTER — Other Ambulatory Visit: Payer: Self-pay | Admitting: Family Medicine

## 2017-03-24 MED ORDER — ALPRAZOLAM 1 MG PO TABS: 1.0000 mg | ORAL_TABLET | Freq: Three times a day (TID) | ORAL | 0 refills | Status: DC | PRN

## 2017-03-24 NOTE — Progress Notes (Signed)
Patient uses xanax very sparingly.  Has not needed an rx since she established care with but her rx is old.  She shows the bottle and it has expired.  Rx printed and given to pt.  Refill is appropriate.

## 2017-03-25 ENCOUNTER — Ambulatory Visit: Payer: 59 | Admitting: Family Medicine

## 2017-03-25 ENCOUNTER — Encounter: Payer: Self-pay | Admitting: Family Medicine

## 2017-03-25 VITALS — BP 124/74 | HR 82 | Temp 98.4°F | Ht 65.5 in | Wt 246.0 lb

## 2017-03-25 DIAGNOSIS — M79672 Pain in left foot: Secondary | ICD-10-CM | POA: Diagnosis not present

## 2017-03-25 DIAGNOSIS — M79671 Pain in right foot: Secondary | ICD-10-CM

## 2017-03-25 NOTE — Assessment & Plan Note (Signed)
Possible for fat pad syndrome. Less likely for PF. Does have some limited dorsiflexion on left and more subtalar shift. Pain is chronic despite injections and other insoles.  - counseled on HEP  - if no improvement would try custom orthotics.

## 2017-03-25 NOTE — Progress Notes (Signed)
Kayla Jacobson - 42 y.o. female MRN [id number]  Date of birth: 1975/12/21  SUBJECTIVE:  Including CC & ROS.  Chief Complaint  Patient presents with  . Orthotic evaluation    Kayla Jacobson is a 42 y.o. female that is here for an evaluation for foot pain. The pain is occurring in each heel pad. Denies any injury to her feet. Pain has been chronic in nature, increasing over the past couple months. Pain is located in her heel. Painful when standing for long periods of time. Has been seen by a podiatrist and has received injections.    Review of Systems  Constitutional: Negative for fever.  HENT: Negative for congestion.   Respiratory: Negative for cough.   Cardiovascular: Negative for chest pain.  Gastrointestinal: Negative for abdominal pain.  Musculoskeletal: Negative for gait problem.  Skin: Negative for color change.  Allergic/Immunologic: Negative for immunocompromised state.  Neurological: Negative for weakness.  Hematological: Negative for adenopathy.  Psychiatric/Behavioral: Negative for agitation.    HISTORY: Past Medical, Surgical, Social, and Family History Reviewed & Updated per EMR.   Pertinent Historical Findings include:  Past Medical History:  Diagnosis Date  . Anxiety   . Arthritis   . Chiari malformation   . Depression    bipolar  . Dysrhythmia   . Fibromyalgia   . Headache    migraines  . Seasonal asthma     Past Surgical History:  Procedure Laterality Date  . ABLATION    . CARPAL TUNNEL RELEASE     bilateral  . CHOLECYSTECTOMY    . HYSTEROSCOPY W/ ENDOMETRIAL ABLATION    . LUMBAR LAMINECTOMY/DECOMPRESSION MICRODISCECTOMY Left 11/21/2015   Procedure: CENTRAL LUMBAR DECOMPRESSION MICRODISCECTOMY L4-L5, MICRODISECTOMY L4-L5 LEFT; FORAMINOTOMY L4 AND L5 ROOT LEFT;  Surgeon: Latanya Maudlin, MD;  Location: [facility];  Service: Orthopedics;  Laterality: Left;  . TUBAL LIGATION    . WISDOM TOOTH EXTRACTION      Allergies  Allergen Reactions  .  Other Anaphylaxis    WALNUTS---THROAT CLOSES UP  . Advair Diskus [Fluticasone-Salmeterol] Hives  . Gabapentin Other (See Comments)    it affected her motivation.     Family History  Problem Relation Age of Onset  . Uterine cancer Mother   . Breast cancer Unknown        grandmother  . Diabetes Father      Social History   Socioeconomic History  . Marital status: Married    Spouse name: Not on file  . Number of children: Not on file  . Years of education: Not on file  . Highest education level: Not on file  Social Needs  . Financial resource strain: Not on file  . Food insecurity - worry: Not on file  . Food insecurity - inability: Not on file  . Transportation needs - medical: Not on file  . Transportation needs - non-medical: Not on file  Occupational History  . Not on file  Tobacco Use  . Smoking status: Former Smoker    Types: E-cigarettes  . Smokeless tobacco: Never Used  Substance and Sexual Activity  . Alcohol use: Yes    Comment: occasional-states 2 month  . Drug use: Not on file  . Sexual activity: Not on file  Other Topics Concern  . Not on file  Social History Narrative  . Not on file     PHYSICAL EXAM:  VS: BP 124/74   Pulse 82   Temp 98.4 F (36.9 C) (Oral)   Ht 5' 5.5" (  1.664 m)   Wt 246 lb (111.6 kg)   SpO2 99%   BMI 40.31 kg/m  Physical Exam Gen: NAD, alert, cooperative with exam, well-appearing ENT: normal lips, normal nasal mucosa,  Eye: normal EOM, normal conjunctiva and lids CV:  no edema, +2 pedal pulses   Resp: no accessory muscle use, non-labored,  Skin: no rashes, no areas of induration  Neuro: normal tone, normal sensation to touch Psych:  normal insight, alert and oriented MSK:  Right and left foot:  Left foot with more of a subtalar shift than right  Morton's foot b/l  No abnormal callus or ulcer formation  Loss of the transverse arch  Posterior tib function more normal on right  Neurovascularly intact        ASSESSMENT & PLAN:   Foot pain, bilateral Possible for fat pad syndrome. Less likely for PF. Does have some limited dorsiflexion on left and more subtalar shift. Pain is chronic despite injections and other insoles.  - counseled on HEP  - if no improvement would try custom orthotics.

## 2017-03-31 ENCOUNTER — Other Ambulatory Visit: Payer: Self-pay | Admitting: Family Medicine

## 2017-03-31 MED ORDER — PREGABALIN 100 MG PO CAPS: 100.0000 mg | ORAL_CAPSULE | Freq: Two times a day (BID) | ORAL | 1 refills | Status: DC

## 2017-03-31 MED ORDER — LISDEXAMFETAMINE DIMESYLATE 70 MG PO CAPS: 70.0000 mg | ORAL_CAPSULE | Freq: Every day | ORAL | 0 refills | Status: DC

## 2017-04-01 ENCOUNTER — Ambulatory Visit: Payer: 59 | Admitting: Family Medicine

## 2017-04-01 ENCOUNTER — Encounter: Payer: Self-pay | Admitting: Family Medicine

## 2017-04-01 DIAGNOSIS — M79671 Pain in right foot: Secondary | ICD-10-CM | POA: Diagnosis not present

## 2017-04-01 DIAGNOSIS — M79672 Pain in left foot: Secondary | ICD-10-CM | POA: Diagnosis not present

## 2017-04-01 NOTE — Assessment & Plan Note (Signed)
Has failed conservative therapy for her foot pain. Will make custom orthotics today   Patient was fitted for a standard, cushioned, semi-rigid orthotic. The orthotic was heated and afterward the patient stood on the orthotic blank positioned on the orthotic stand. The patient was positioned in subtalar neutral position and 10 degrees of ankle dorsiflexion in a weight bearing stance. After completion of molding, a stable base was applied to the orthotic blank. The blank was ground to a stable position for weight bearing. Size: 8 Base: Blue EVA Additional Posting and Padding: None The patient ambulated these, and they were very comfortable.

## 2017-04-01 NOTE — Progress Notes (Signed)
Kayla Jacobson - 42 y.o. female MRN [id number]  Date of birth: Dec 11, 1975  SUBJECTIVE:  Including CC & ROS.  No chief complaint on file.   Kayla Jacobson is a 42 y.o. female that is her to have custom orthotics made.    Review of Systems  HISTORY: Past Medical, Surgical, Social, and Family History Reviewed & Updated per EMR.   Pertinent Historical Findings include:  Past Medical History:  Diagnosis Date  . Anxiety   . Arthritis   . Chiari malformation   . Depression    bipolar  . Dysrhythmia   . Fibromyalgia   . Headache    migraines  . Seasonal asthma     Past Surgical History:  Procedure Laterality Date  . ABLATION    . CARPAL TUNNEL RELEASE     bilateral  . CHOLECYSTECTOMY    . HYSTEROSCOPY W/ ENDOMETRIAL ABLATION    . LUMBAR LAMINECTOMY/DECOMPRESSION MICRODISCECTOMY Left 11/21/2015   Procedure: CENTRAL LUMBAR DECOMPRESSION MICRODISCECTOMY L4-L5, MICRODISECTOMY L4-L5 LEFT; FORAMINOTOMY L4 AND L5 ROOT LEFT;  Surgeon: Latanya Maudlin, MD;  Location: [facility];  Service: Orthopedics;  Laterality: Left;  . TUBAL LIGATION    . WISDOM TOOTH EXTRACTION      Allergies  Allergen Reactions  . Other Anaphylaxis    WALNUTS---THROAT CLOSES UP  . Advair Diskus [Fluticasone-Salmeterol] Hives  . Gabapentin Other (See Comments)    it affected her motivation.     Family History  Problem Relation Age of Onset  . Uterine cancer Mother   . Breast cancer Unknown        grandmother  . Diabetes Father      Social History   Socioeconomic History  . Marital status: Married    Spouse name: Not on file  . Number of children: Not on file  . Years of education: Not on file  . Highest education level: Not on file  Occupational History  . Not on file  Social Needs  . Financial resource strain: Not on file  . Food insecurity:    Worry: Not on file    Inability: Not on file  . Transportation needs:    Medical: Not on file    Non-medical: Not on file  Tobacco Use    . Smoking status: Former Smoker    Types: E-cigarettes  . Smokeless tobacco: Never Used  Substance and Sexual Activity  . Alcohol use: Yes    Comment: occasional-states 2 month  . Drug use: Not on file  . Sexual activity: Not on file  Lifestyle  . Physical activity:    Days per week: Not on file    Minutes per session: Not on file  . Stress: Not on file  Relationships  . Social connections:    Talks on phone: Not on file    Gets together: Not on file    Attends religious service: Not on file    Active member of club or organization: Not on file    Attends meetings of clubs or organizations: Not on file    Relationship status: Not on file  . Intimate partner violence:    Fear of current or ex partner: Not on file    Emotionally abused: Not on file    Physically abused: Not on file    Forced sexual activity: Not on file  Other Topics Concern  . Not on file  Social History Narrative  . Not on file     PHYSICAL EXAM:  VS: BP 117/72  Pulse 72   Ht 5\' 7"  (1.702 m)   Wt 246 lb (111.6 kg)   SpO2 97%   BMI 38.53 kg/m  Physical Exam Gen: NAD, alert, cooperative with exam, well-appearing       ASSESSMENT & PLAN:   Foot pain, bilateral Has failed conservative therapy for her foot pain. Will make custom orthotics today   Patient was fitted for a standard, cushioned, semi-rigid orthotic. The orthotic was heated and afterward the patient stood on the orthotic blank positioned on the orthotic stand. The patient was positioned in subtalar neutral position and 10 degrees of ankle dorsiflexion in a weight bearing stance. After completion of molding, a stable base was applied to the orthotic blank. The blank was ground to a stable position for weight bearing. Size: 8 Base: Blue EVA Additional Posting and Padding: None The patient ambulated these, and they were very comfortable.

## 2017-04-22 ENCOUNTER — Other Ambulatory Visit: Payer: Self-pay | Admitting: Family Medicine

## 2017-04-22 MED ORDER — AZITHROMYCIN 250 MG PO TABS: ORAL_TABLET | ORAL | 0 refills | Status: DC

## 2017-04-22 MED FILL — AZITHROMYCIN 250 MG TABLET: 250 | 5 days supply | Qty: 6 | Fill #0

## 2017-04-30 ENCOUNTER — Ambulatory Visit: Payer: 59 | Admitting: Nurse Practitioner

## 2017-04-30 VITALS — BP 124/76 | HR 78 | Temp 98.6°F | Wt 248.0 lb

## 2017-04-30 DIAGNOSIS — D235 Other benign neoplasm of skin of trunk: Secondary | ICD-10-CM | POA: Diagnosis not present

## 2017-04-30 DIAGNOSIS — L819 Disorder of pigmentation, unspecified: Secondary | ICD-10-CM

## 2017-04-30 NOTE — Progress Notes (Signed)
Subjective:  Patient ID: Kayla Jacobson, female    DOB: [birth year]  Age: 42 y.o. MRN: [id number]  CC: Rash (present x 15yrs)  Rash  This is a chronic problem. The current episode started more than 1 year ago. The problem has been waxing and waning since onset. The affected locations include the back. The rash is characterized by itchiness, redness and scaling. She was exposed to an insect bite/sting (tick bite 2years ago). Pertinent negatives include no anorexia, fatigue or joint pain. Past treatments include nothing.    Outpatient Medications Prior to Visit  Medication Sig Dispense Refill  . ALPRAZolam (XANAX) 1 MG tablet Take 1 tablet (1 mg total) by mouth 3 (three) times daily as needed for anxiety. 60 tablet 0  . ASHWAGANDHA PO Take 800 mg by mouth daily.    Marland Kitchen azithromycin (ZITHROMAX) 250 MG tablet 2 tabs by mouth on day 1 followed by 1 tab by mouth daily days 2- 5 6 tablet 0  . Biotin 5000 MCG TABS Take 1 tablet daily by mouth.    . Cholecalciferol 5000 units TABS Take 1 tablet daily by mouth.    Marland Kitchen FLUoxetine (PROZAC) 40 MG capsule Take 1 capsule (40 mg total) by mouth daily. 90 capsule 3  . GLUCOSAMINE-CHONDROITIN PO Take 2 tablets daily by mouth.    . lisdexamfetamine (VYVANSE) 70 MG capsule Take 1 capsule (70 mg total) by mouth daily. 30 capsule 0  . pregabalin (LYRICA) 100 MG capsule Take 1 capsule (100 mg total) by mouth 2 (two) times daily. 60 capsule 1  . TURMERIC PO Take 1 tablet daily by mouth.     No facility-administered medications prior to visit.     ROS See HPI  Objective:  BP 124/76 (BP Location: Left Arm, Patient Position: Sitting, Cuff Size: Large)   Pulse 78   Temp 98.6 F (37 C)   Wt 248 lb (112.5 kg)   BMI 38.84 kg/m   BP Readings from Last 3 Encounters:  04/30/17 124/76  04/01/17 117/72  03/25/17 124/74    Wt Readings from Last 3 Encounters:  04/30/17 248 lb (112.5 kg)  04/01/17 246 lb (111.6 kg)  03/25/17 246 lb (111.6 kg)    Physical  Exam  Constitutional: No distress.  Cardiovascular: Normal rate.  Pulmonary/Chest: Effort normal.  Skin: Rash noted. Rash is papular.     Psychiatric: She has a normal mood and affect. Her behavior is normal.  Vitals reviewed.   Lab Results  Component Value Date   WBC 4.7 02/16/2017   HGB 13.4 02/16/2017   HCT 39.9 02/16/2017   PLT 236.0 02/16/2017   GLUCOSE 97 02/16/2017   CHOL 215 (H) 02/16/2017   TRIG 97.0 02/16/2017   HDL 52.30 02/16/2017   LDLCALC 144 (H) 02/16/2017   ALT 22 02/16/2017   AST 21 02/16/2017   NA 141 02/16/2017   K 4.3 02/16/2017   CL 104 02/16/2017   CREATININE 0.62 02/16/2017   BUN 11 02/16/2017   CO2 29 02/16/2017   TSH 1.43 02/16/2017   INR 0.95 11/14/2015   Procedure including risks/benefits explained to patient.   Questions were answered.  After verbal informed consent was obtained and a time out completed. Site was cleansed with betadine and then alcohol. 1% Lidocaine with epinephrine was injected under lesion and then elliptical biopsy was performed (1cm diameter around lesion).  Hemostasis was obtained by placing 3sutures with #3 ethilon suture. Pt tolerated procedure well.   Specimen sent for pathology review.  Instructed to change dressing 1-2times a day, apply triple topical antibiotic ointment x 3ays, then cover with dry bandaid only. She is to return to office in 1week for suture removal. Pt instructed to keep the area dry for 24 hours and to contact us if he develops redness, drainage or swelling at the site.  Pt may use tylenol as needed for discomfort today.   Assessment & Plan:   Kayla Jacobson was seen today for rash.  Diagnoses and all orders for this visit:  Atypical pigmented lesion -     Cancel: Dermatology pathology -     Pathology   I am having Kayla Jacobson. [name] maintain her ASHWAGANDHA PO, TURMERIC PO, Cholecalciferol, Biotin, GLUCOSAMINE-CHONDROITIN PO, FLUoxetine, ALPRAZolam, lisdexamfetamine, pregabalin, and  azithromycin.  No orders of the defined types were placed in this encounter.   Follow-up: Return in about 1 week (around 05/07/2017) for suture removal.  Wilfred Lacy, NP

## 2017-05-01 ENCOUNTER — Encounter: Payer: Self-pay | Admitting: Nurse Practitioner

## 2017-05-01 NOTE — Patient Instructions (Signed)
nstructed to change dressing 1-2times a day, apply tiple topical antibiotic ointment x 3ays, then cover with dry bandaid only. She is to return to office in 1week for suture removal. Pt instructed to keep the area dry for 24 hours and to contact us if he develops redness, drainage or swelling at the site.  Pt may use tylenol as needed for discomfort today.

## 2017-05-04 ENCOUNTER — Other Ambulatory Visit: Payer: Self-pay | Admitting: Family Medicine

## 2017-05-04 MED ORDER — PREGABALIN 150 MG PO CAPS: 150.0000 mg | ORAL_CAPSULE | Freq: Two times a day (BID) | ORAL | 3 refills | Status: DC

## 2017-05-04 MED ORDER — LISDEXAMFETAMINE DIMESYLATE 70 MG PO CAPS: 70.0000 mg | ORAL_CAPSULE | Freq: Every day | ORAL | 0 refills | Status: DC

## 2017-05-05 ENCOUNTER — Other Ambulatory Visit: Payer: Self-pay | Admitting: Family Medicine

## 2017-05-05 MED ORDER — AMPHETAMINE-DEXTROAMPHETAMINE 20 MG PO TABS: 20.0000 mg | ORAL_TABLET | Freq: Three times a day (TID) | ORAL | 0 refills | Status: DC

## 2017-05-05 NOTE — Progress Notes (Signed)
Discussed with pt- she is needing short acting adderall again to use as needed up to three times daily with her daily vyvase.  This has been her regiment for years with psychiatry. Notes reviewed. Rx printed.

## 2017-05-06 LAB — TISSUE SPECIMEN

## 2017-05-06 LAB — PATHOLOGY

## 2017-05-12 ENCOUNTER — Other Ambulatory Visit: Payer: Self-pay | Admitting: Family Medicine

## 2017-05-12 MED ORDER — FEXOFENADINE HCL 180 MG PO TABS: 180.0000 mg | ORAL_TABLET | Freq: Every day | ORAL | 3 refills | Status: DC

## 2017-05-12 MED ORDER — ALPRAZOLAM 1 MG PO TABS: 1.0000 mg | ORAL_TABLET | Freq: Three times a day (TID) | ORAL | 0 refills | Status: DC | PRN

## 2017-05-12 MED FILL — FEXOFENADINE HCL 180 MG TAB: 180 | 30 days supply | Qty: 30 | Fill #0

## 2017-05-12 MED FILL — DEXTROAMP-AMPHETAMIN 20 MG: 20 | 30 days supply | Qty: 90 | Fill #0

## 2017-05-12 MED FILL — ALPRAZolam 1 MG TABS: 1 | 30 days supply | Qty: 90 | Fill #0

## 2017-05-13 ENCOUNTER — Encounter: Payer: Self-pay | Admitting: Family Medicine

## 2017-05-13 DIAGNOSIS — F319 Bipolar disorder, unspecified: Secondary | ICD-10-CM | POA: Insufficient documentation

## 2017-05-13 DIAGNOSIS — F4310 Post-traumatic stress disorder, unspecified: Secondary | ICD-10-CM | POA: Insufficient documentation

## 2017-05-13 DIAGNOSIS — F988 Other specified behavioral and emotional disorders with onset usually occurring in childhood and adolescence: Secondary | ICD-10-CM

## 2017-05-13 DIAGNOSIS — F419 Anxiety disorder, unspecified: Secondary | ICD-10-CM | POA: Insufficient documentation

## 2017-05-13 DIAGNOSIS — F3170 Bipolar disorder, currently in remission, most recent episode unspecified: Secondary | ICD-10-CM

## 2017-05-13 DIAGNOSIS — E282 Polycystic ovarian syndrome: Secondary | ICD-10-CM | POA: Insufficient documentation

## 2017-05-13 DIAGNOSIS — G935 Compression of brain: Secondary | ICD-10-CM

## 2017-05-13 NOTE — Progress Notes (Addendum)
Patient established care with me in 01/2017, well woman exam done at that time.  I have agreed to take over her medication management as she was followed by psychiatry and now stable on current medication regimen for an extended period of time. I have no red flags or concerns.  Had formal evaluation by psychiatry who has diagnosed her with ADD, bipolar disorder with anxiety (in remission), PTSD, and has a therapist who she sees prn but has great insight into when she needs to see her.  Currently taking Vyvanse daily, Adderall as needed, Prozac daily, Lyrica daily, as needed xanax.  Very compliant with her medications and has not asked for inappropriate refills.

## 2017-05-18 ENCOUNTER — Telehealth: Payer: Self-pay | Admitting: Family Medicine

## 2017-05-18 MED ORDER — AMOXICILLIN-POT CLAVULANATE 875-125 MG PO TABS: 1.0000 | ORAL_TABLET | Freq: Two times a day (BID) | ORAL | 0 refills | Status: AC

## 2017-05-18 MED FILL — AMOX-CLAV 875-125 MG TABLET: 875-125 | 10 days supply | Qty: 20 | Fill #0

## 2017-05-18 NOTE — Telephone Encounter (Signed)
Pt complains of 2 weeks of progressive ear pressure, right > left.  + fatigue.  No cough.  No SOB.  No wheezing. On exam, Thick right ear effusion, right TM erythematous. She has been taking Allegra, Sudafed and Flonase. Given duration and progression of symptoms, will treat for bacterial process with 10 day course of Augmentin twice daily. Call or return to clinic prn if these symptoms worsen or fail to improve as anticipated. The patient indicates understanding of these issues and agrees with the plan.

## 2017-05-25 ENCOUNTER — Other Ambulatory Visit: Payer: Self-pay | Admitting: Family Medicine

## 2017-05-25 MED ORDER — LISDEXAMFETAMINE DIMESYLATE 70 MG PO CAPS: 70.0000 mg | ORAL_CAPSULE | Freq: Every day | ORAL | 0 refills | Status: DC

## 2017-05-25 MED ORDER — ALPRAZOLAM 1 MG PO TABS: 1.0000 mg | ORAL_TABLET | Freq: Three times a day (TID) | ORAL | 0 refills | Status: DC | PRN

## 2017-05-25 MED ORDER — AMPHETAMINE-DEXTROAMPHETAMINE 20 MG PO TABS: 20.0000 mg | ORAL_TABLET | Freq: Three times a day (TID) | ORAL | 0 refills | Status: DC

## 2017-06-02 MED FILL — VYVANSE 70 MG CAPSULE: 70 | 30 days supply | Qty: 30 | Fill #0

## 2017-06-03 ENCOUNTER — Telehealth: Payer: Self-pay | Admitting: Family Medicine

## 2017-06-03 MED ORDER — PREDNISONE 20 MG PO TABS: 40.0000 mg | ORAL_TABLET | Freq: Every day | ORAL | 0 refills | Status: DC

## 2017-06-03 NOTE — Telephone Encounter (Signed)
Pt has persistent ETD- ears examined and confirmed.  Despite taking Zylzal and allegra D, symptoms not improving.  She is not allergic to prednisone.  eRx sent for 7 day course of prednisone to pharmacy on file(does not need to taper).

## 2017-06-04 ENCOUNTER — Other Ambulatory Visit: Payer: Self-pay | Admitting: Family Medicine

## 2017-06-04 MED ORDER — CIPROFLOXACIN HCL 500 MG PO TABS: 500.0000 mg | ORAL_TABLET | Freq: Two times a day (BID) | ORAL | 0 refills | Status: DC

## 2017-06-08 MED FILL — AMPHETAMINE SALTS 20 MG TAB: 20 | 30 days supply | Qty: 90 | Fill #0

## 2017-06-08 MED FILL — ALPRAZolam 1 MG TABS: 1 | 30 days supply | Qty: 90 | Fill #0

## 2017-06-22 ENCOUNTER — Other Ambulatory Visit: Payer: Self-pay | Admitting: Family Medicine

## 2017-06-22 MED ORDER — AMPHETAMINE-DEXTROAMPHETAMINE 20 MG PO TABS: 20.0000 mg | ORAL_TABLET | Freq: Three times a day (TID) | ORAL | 0 refills | Status: DC

## 2017-06-22 MED ORDER — ALPRAZOLAM 2 MG PO TABS: 2.0000 mg | ORAL_TABLET | Freq: Three times a day (TID) | ORAL | 0 refills | Status: DC | PRN

## 2017-06-22 MED ORDER — LISDEXAMFETAMINE DIMESYLATE 70 MG PO CAPS: 70.0000 mg | ORAL_CAPSULE | Freq: Every day | ORAL | 0 refills | Status: DC

## 2017-06-22 MED ORDER — PREGABALIN 150 MG PO CAPS: 150.0000 mg | ORAL_CAPSULE | Freq: Two times a day (BID) | ORAL | 3 refills | Status: DC

## 2017-06-22 MED ORDER — FLUOXETINE HCL 40 MG PO CAPS: 40.0000 mg | ORAL_CAPSULE | Freq: Every day | ORAL | 3 refills | Status: DC

## 2017-06-22 NOTE — Progress Notes (Signed)
Increased stressors.  1 mg xanax not been effective.  Will increase dose to 2 mg and she will follow up with m.

## 2017-07-06 ENCOUNTER — Other Ambulatory Visit: Payer: Self-pay | Admitting: Family Medicine

## 2017-07-06 MED ORDER — PREGABALIN 150 MG PO CAPS: 150.0000 mg | ORAL_CAPSULE | Freq: Two times a day (BID) | ORAL | 3 refills | Status: DC

## 2017-07-06 MED ORDER — AMPHETAMINE-DEXTROAMPHETAMINE 20 MG PO TABS: 20.0000 mg | ORAL_TABLET | Freq: Three times a day (TID) | ORAL | 0 refills | Status: DC

## 2017-07-06 MED ORDER — ALPRAZOLAM 2 MG PO TABS: 2.0000 mg | ORAL_TABLET | Freq: Three times a day (TID) | ORAL | 0 refills | Status: DC | PRN

## 2017-07-06 MED ORDER — LISDEXAMFETAMINE DIMESYLATE 70 MG PO CAPS: 70.0000 mg | ORAL_CAPSULE | Freq: Every day | ORAL | 0 refills | Status: DC

## 2017-07-06 MED FILL — AMPHETAMINE SALTS 20 MG TAB: 20 | 30 days supply | Qty: 90 | Fill #0

## 2017-07-16 ENCOUNTER — Telehealth: Payer: Self-pay | Admitting: Family Medicine

## 2017-07-16 MED ORDER — PREGABALIN 300 MG PO CAPS: 300.0000 mg | ORAL_CAPSULE | Freq: Two times a day (BID) | ORAL | 0 refills | Status: DC

## 2017-07-16 NOTE — Telephone Encounter (Signed)
Patient is having increased back pain, affecting quality of life.  Initially increasing Lyrica to 150 mg twice daily did help tremendously with her pain. Past several days pain is worse.  No known injury.  Will increase Lyrica to 300 mg twice daily for one month or less (if pain improves sooner).  Decrease dose back down to 150 mg twice daily as tolerated.  The patient indicates understanding of these issues and agrees with the plan.

## 2017-07-19 ENCOUNTER — Other Ambulatory Visit: Payer: Self-pay | Admitting: Family Medicine

## 2017-07-19 MED ORDER — ALPRAZOLAM 2 MG PO TABS: 2.0000 mg | ORAL_TABLET | Freq: Three times a day (TID) | ORAL | 0 refills | Status: DC | PRN

## 2017-07-21 ENCOUNTER — Other Ambulatory Visit: Payer: Self-pay | Admitting: Family Medicine

## 2017-07-21 MED ORDER — LISDEXAMFETAMINE DIMESYLATE 70 MG PO CAPS: 70.0000 mg | ORAL_CAPSULE | Freq: Every day | ORAL | 0 refills | Status: DC

## 2017-07-21 MED ORDER — ALPRAZOLAM 2 MG PO TABS: 2.0000 mg | ORAL_TABLET | Freq: Three times a day (TID) | ORAL | 0 refills | Status: DC | PRN

## 2017-07-21 MED ORDER — AMPHETAMINE-DEXTROAMPHETAMINE 20 MG PO TABS: 20.0000 mg | ORAL_TABLET | Freq: Three times a day (TID) | ORAL | 0 refills | Status: DC

## 2017-07-21 MED FILL — ALPRAZolam 2 MG TABS: 2 | 30 days supply | Qty: 90 | Fill #0

## 2017-07-23 ENCOUNTER — Other Ambulatory Visit: Payer: Self-pay | Admitting: Family Medicine

## 2017-07-23 MED ORDER — AMOXICILLIN-POT CLAVULANATE 875-125 MG PO TABS: 1.0000 | ORAL_TABLET | Freq: Two times a day (BID) | ORAL | 0 refills | Status: DC

## 2017-07-28 ENCOUNTER — Other Ambulatory Visit: Payer: Self-pay | Admitting: Family Medicine

## 2017-07-28 MED ORDER — AMPHETAMINE-DEXTROAMPHETAMINE 30 MG PO TABS: 30.0000 mg | ORAL_TABLET | Freq: Three times a day (TID) | ORAL | 0 refills | Status: DC

## 2017-07-28 NOTE — Progress Notes (Signed)
Discussed ADD symptoms with pt- she has had improvement with adding as needed adderrall to scheduled daily vyvanse.  Does feel 30 mg would likely work better, 20 mg wears off quickly. Pt reviewed in [place] controlled substances database.  No red flags.

## 2017-07-29 MED FILL — VYVANSE 70 MG CAPSULE: 70 | 30 days supply | Qty: 30 | Fill #0

## 2017-07-29 MED FILL — AMPHETAMINE SALTS 30 MG TAB: 30 | 30 days supply | Qty: 90 | Fill #0

## 2017-08-10 ENCOUNTER — Telehealth: Payer: Self-pay | Admitting: Family Medicine

## 2017-08-10 NOTE — Telephone Encounter (Signed)
Discussed symptoms with patient.  Anxiety increased last week due to stressors at work but she feels now things have calmed down.  Does not need to take xanax or as needed adderall (for her ADD) as often.

## 2017-08-12 ENCOUNTER — Other Ambulatory Visit: Payer: Self-pay | Admitting: Family Medicine

## 2017-08-12 MED ORDER — LISDEXAMFETAMINE DIMESYLATE 70 MG PO CAPS: 70.0000 mg | ORAL_CAPSULE | Freq: Every day | ORAL | 0 refills | Status: DC

## 2017-08-12 MED ORDER — ALPRAZOLAM 2 MG PO TABS: 2.0000 mg | ORAL_TABLET | Freq: Three times a day (TID) | ORAL | 0 refills | Status: DC | PRN

## 2017-08-12 MED ORDER — AMPHETAMINE-DEXTROAMPHETAMINE 30 MG PO TABS: 30.0000 mg | ORAL_TABLET | Freq: Three times a day (TID) | ORAL | 0 refills | Status: DC

## 2017-08-17 MED FILL — ALPRAZolam 2 MG TABS: 2 | 30 days supply | Qty: 90 | Fill #0

## 2017-08-25 ENCOUNTER — Other Ambulatory Visit: Payer: Self-pay | Admitting: Family Medicine

## 2017-08-25 MED ORDER — AMOXICILLIN-POT CLAVULANATE 875-125 MG PO TABS: 1.0000 | ORAL_TABLET | Freq: Two times a day (BID) | ORAL | 0 refills | Status: DC

## 2017-08-31 ENCOUNTER — Other Ambulatory Visit: Payer: Self-pay | Admitting: Family Medicine

## 2017-08-31 MED ORDER — PREGABALIN 150 MG PO CAPS: 300.0000 mg | ORAL_CAPSULE | Freq: Two times a day (BID) | ORAL | 3 refills | Status: DC

## 2017-08-31 MED FILL — VYVANSE 70 MG CAPSULE: 70 | 30 days supply | Qty: 30 | Fill #0

## 2017-08-31 MED FILL — AMPHETAMINE SALTS 30 MG TAB: 30 | 30 days supply | Qty: 90 | Fill #0

## 2017-09-01 ENCOUNTER — Other Ambulatory Visit: Payer: Self-pay | Admitting: Family Medicine

## 2017-09-01 MED ORDER — DICYCLOMINE HCL 10 MG PO CAPS: 10.0000 mg | ORAL_CAPSULE | Freq: Three times a day (TID) | ORAL | 0 refills | Status: DC

## 2017-09-09 ENCOUNTER — Other Ambulatory Visit: Payer: Self-pay | Admitting: Family Medicine

## 2017-09-09 MED ORDER — ALPRAZOLAM 2 MG PO TABS: 2.0000 mg | ORAL_TABLET | Freq: Three times a day (TID) | ORAL | 0 refills | Status: DC | PRN

## 2017-09-11 ENCOUNTER — Telehealth: Payer: Self-pay | Admitting: Family Medicine

## 2017-09-11 MED ORDER — VARENICLINE TARTRATE 1 MG PO TABS: 1.0000 mg | ORAL_TABLET | Freq: Two times a day (BID) | ORAL | 3 refills | Status: DC

## 2017-09-11 MED FILL — CHANTIX 1 MG TABLET: 1 | 28 days supply | Qty: 56 | Fill #0

## 2017-09-11 NOTE — Telephone Encounter (Signed)
Discussed smoking cessation with patient.  She is ready to start Chantix.  Has starter pack already and requesting continuing pack.  eRx sent as requested.

## 2017-09-14 MED FILL — ALPRAZolam 2 MG TABS: 2 | 30 days supply | Qty: 90 | Fill #0

## 2017-09-17 ENCOUNTER — Other Ambulatory Visit: Payer: Self-pay | Admitting: Family Medicine

## 2017-09-17 MED ORDER — AZITHROMYCIN 250 MG PO TABS: ORAL_TABLET | ORAL | 0 refills | Status: DC

## 2017-09-17 MED FILL — AZITHROMYCIN 250 MG TABLET: 250 | 5 days supply | Qty: 6 | Fill #0

## 2017-09-28 ENCOUNTER — Other Ambulatory Visit: Payer: Self-pay | Admitting: Family Medicine

## 2017-09-28 MED ORDER — LISDEXAMFETAMINE DIMESYLATE 70 MG PO CAPS: 70.0000 mg | ORAL_CAPSULE | Freq: Every day | ORAL | 0 refills | Status: DC

## 2017-09-28 MED ORDER — AMPHETAMINE-DEXTROAMPHETAMINE 30 MG PO TABS: 30.0000 mg | ORAL_TABLET | Freq: Three times a day (TID) | ORAL | 0 refills | Status: DC

## 2017-09-28 MED FILL — DEXTROAMPH TB 30MG NSTR 100: 30 | 30 days supply | Qty: 90 | Fill #0

## 2017-09-28 MED FILL — VYVANSE 70 MG CAPSULE: 70 | 30 days supply | Qty: 30 | Fill #0

## 2017-09-29 ENCOUNTER — Other Ambulatory Visit: Payer: Self-pay | Admitting: Family Medicine

## 2017-09-29 MED ORDER — LEVOFLOXACIN 500 MG PO TABS: 500.0000 mg | ORAL_TABLET | Freq: Every day | ORAL | 0 refills | Status: DC

## 2017-09-29 MED FILL — levoFLOXacin 500 MG TABS: 500 | 7 days supply | Qty: 7 | Fill #0

## 2017-10-05 ENCOUNTER — Other Ambulatory Visit: Payer: Self-pay | Admitting: Family Medicine

## 2017-10-05 MED ORDER — ALPRAZOLAM 2 MG PO TABS: 2.0000 mg | ORAL_TABLET | Freq: Three times a day (TID) | ORAL | 0 refills | Status: DC | PRN

## 2017-10-05 MED ORDER — PREGABALIN 200 MG PO CAPS: 200.0000 mg | ORAL_CAPSULE | Freq: Two times a day (BID) | ORAL | 3 refills | Status: DC

## 2017-10-05 MED ORDER — LISDEXAMFETAMINE DIMESYLATE 70 MG PO CAPS: 70.0000 mg | ORAL_CAPSULE | Freq: Every day | ORAL | 0 refills | Status: DC

## 2017-10-12 MED FILL — ALPRAZolam 2 MG TABS: 2 | 30 days supply | Qty: 90 | Fill #0

## 2017-10-25 ENCOUNTER — Other Ambulatory Visit: Payer: Self-pay | Admitting: Family Medicine

## 2017-10-25 MED ORDER — AMPHETAMINE-DEXTROAMPHETAMINE 30 MG PO TABS: 30.0000 mg | ORAL_TABLET | Freq: Three times a day (TID) | ORAL | 0 refills | Status: DC

## 2017-10-25 MED ORDER — LISDEXAMFETAMINE DIMESYLATE 70 MG PO CAPS: 70.0000 mg | ORAL_CAPSULE | Freq: Every day | ORAL | 0 refills | Status: DC

## 2017-10-25 MED ORDER — ALPRAZOLAM 2 MG PO TABS: 2.0000 mg | ORAL_TABLET | Freq: Three times a day (TID) | ORAL | 0 refills | Status: DC | PRN

## 2017-10-26 ENCOUNTER — Telehealth: Payer: Self-pay | Admitting: Family Medicine

## 2017-10-26 MED ORDER — PREDNISONE 10 MG PO TABS: ORAL_TABLET | ORAL | 0 refills | Status: DC

## 2017-10-26 MED FILL — VYVANSE 70 MG CAPSULE: 70 | 30 days supply | Qty: 30 | Fill #0

## 2017-10-26 MED FILL — predniSONE 10 MG TABS: 10 | 7 days supply | Qty: 15 | Fill #0

## 2017-10-26 MED FILL — DEXTROAMPH TB 30MG NSTR 100: 30 | 30 days supply | Qty: 90 | Fill #0

## 2017-10-26 NOTE — Telephone Encounter (Signed)
Patient has multiple, linear, raised itchy areas consistent with previous poison sumac exposure.  Was walking in the woods this weekend.  Prednisone rx sent.

## 2017-10-28 ENCOUNTER — Telehealth: Payer: Self-pay | Admitting: Family Medicine

## 2017-10-28 MED ORDER — HYDROXYZINE PAMOATE 25 MG PO CAPS: 25.0000 mg | ORAL_CAPSULE | Freq: Three times a day (TID) | ORAL | 0 refills | Status: DC | PRN

## 2017-10-28 NOTE — Telephone Encounter (Signed)
Patient is being treated for plant dermatitis with oral prednisone.  Has been itching more at night, OTC antihistamines are not effective.  Will send in low dose vistaril to [place] as needed for itching.   Discussed sedation precautions.  Call or return to clinic prn if these symptoms worsen or fail to improve as anticipated. The patient indicates understanding of these issues and agrees with the plan.

## 2017-10-29 ENCOUNTER — Other Ambulatory Visit: Payer: Self-pay | Admitting: Family Medicine

## 2017-10-29 MED ORDER — PREDNISONE 20 MG PO TABS: ORAL_TABLET | ORAL | 0 refills | Status: DC

## 2017-10-31 ENCOUNTER — Encounter (HOSPITAL_COMMUNITY): Payer: Self-pay | Admitting: Emergency Medicine

## 2017-10-31 ENCOUNTER — Emergency Department (HOSPITAL_COMMUNITY)
Admission: EM | Admit: 2017-10-31 | Discharge: 2017-10-31 | Disposition: A | Discharge status: Home / Self Care | Payer: 59 | Attending: Emergency Medicine | Admitting: Emergency Medicine

## 2017-10-31 ENCOUNTER — Other Ambulatory Visit: Payer: Self-pay

## 2017-10-31 DIAGNOSIS — R21 Rash and other nonspecific skin eruption: Secondary | ICD-10-CM | POA: Diagnosis present

## 2017-10-31 DIAGNOSIS — L237 Allergic contact dermatitis due to plants, except food: Secondary | ICD-10-CM | POA: Insufficient documentation

## 2017-10-31 DIAGNOSIS — Z87891 Personal history of nicotine dependence: Secondary | ICD-10-CM | POA: Diagnosis not present

## 2017-10-31 DIAGNOSIS — Z79899 Other long term (current) drug therapy: Secondary | ICD-10-CM | POA: Insufficient documentation

## 2017-10-31 MED ORDER — DEXAMETHASONE SODIUM PHOSPHATE 10 MG/ML IJ SOLN
10.0000 mg | Freq: Once | INTRAMUSCULAR | Status: AC
  Administered 2017-10-31: 10 mg via INTRAMUSCULAR
  Filled 2017-10-31: qty 1

## 2017-10-31 MED ORDER — TRIAMCINOLONE ACETONIDE 0.025 % EX OINT: 1.0000 "application " | TOPICAL_OINTMENT | Freq: Two times a day (BID) | CUTANEOUS | 1 refills | Status: DC

## 2017-10-31 MED ORDER — ALUM SULFATE-CA ACETATE EX PACK: 1.0000 | PACK | Freq: Three times a day (TID) | CUTANEOUS | 1 refills | Status: DC

## 2017-10-31 NOTE — ED Notes (Signed)
ED Provider at bedside. 

## 2017-10-31 NOTE — ED Provider Notes (Signed)
[facility] Provider Note   CSN: [id number] Arrival date & time: 10/31/17  0102     History   Chief Complaint Chief Complaint  Patient presents with  . Rash  . Allergic Reaction    HPI Kayla Jacobson is a 42 y.o. female.  HPI  The patient is a 42 year old female, she has a known history of bipolar disorder but also has a history of allergies including allergies to poison ivy which has been quite severe in the past, she has an allergy to walnuts, she denies any food ingestions that might of caused her to have allergic symptoms however she does report that after being exposed to poison ivy 4 days ago and having some rash on her arms and her face she went to the doctor's office where she received a steroid shot and oral prednisone to take 60 mg a day, she has had this for days in a row but states that she is continuing to have some swelling including some intranasal congestion and feeling like her throat might be swelling.  She denies any nausea vomiting fevers coughing and does not feel prickly short of breath other than when she exerts herself.  She has been taking antihistamines as well as the prednisone, states that it is helping a little bit but she still has significant rash across the upper neck and back, her bilateral antecubital fossa, her lower abdomen and some scattered spots on her legs.  She has some spots on her face with some left periorbital swelling as well.  Past Medical History:  Diagnosis Date  . ADD (attention deficit disorder)   . Anxiety   . Arthritis   . Bipolar disorder ([place])   . Chiari malformation   . Depression    bipolar  . Dysrhythmia   . Fibromyalgia   . Headache    migraines  . Seasonal asthma     Patient Active Problem List   Diagnosis Date Noted  . ADD (attention deficit disorder) 05/13/2017  . PTSD (post-traumatic stress disorder) 05/13/2017  . Bipolar disorder ([place]) 05/13/2017  . Anxiety  disorder 05/13/2017  . Chiari malformation type I ([place]) 05/13/2017  . PCOS (polycystic ovarian syndrome) 05/13/2017  . Foot pain, bilateral 03/25/2017  . Well woman exam with routine gynecological exam 02/05/2017  . Herniated intervertebral disc of lumbar spine 11/21/2015    Past Surgical History:  Procedure Laterality Date  . ABLATION    . CARPAL TUNNEL RELEASE     bilateral  . CHOLECYSTECTOMY    . HYSTEROSCOPY W/ ENDOMETRIAL ABLATION    . LUMBAR LAMINECTOMY/DECOMPRESSION MICRODISCECTOMY Left 11/21/2015   Procedure: CENTRAL LUMBAR DECOMPRESSION MICRODISCECTOMY L4-L5, MICRODISECTOMY L4-L5 LEFT; FORAMINOTOMY L4 AND L5 ROOT LEFT;  Surgeon: Latanya Maudlin, MD;  Location: [facility];  Service: Orthopedics;  Laterality: Left;  . TUBAL LIGATION    . WISDOM TOOTH EXTRACTION       OB History   None      Home Medications    Prior to Admission medications   Medication Sig Start Date End Date Taking? Authorizing Provider  alprazolam Duanne Moron) 2 MG tablet Take 1 tablet (2 mg total) by mouth 3 (three) times daily as needed for anxiety or sleep. 10/25/17  Yes Lucille Passy, MD  amphetamine-dextroamphetamine (ADDERALL) 30 MG tablet Take 1 tablet by mouth 3 (three) times daily. Patient taking differently: Take 30 mg by mouth 2 (two) times daily.  10/25/17  Yes Lucille Passy, MD  ASHWAGANDHA PO Take  800 mg by mouth daily.   Yes [provider]  Biotin 5000 MCG TABS Take 1 tablet daily by mouth.   Yes [provider]  Cholecalciferol 5000 units TABS Take 1 tablet daily by mouth.   Yes [provider]  dicyclomine (BENTYL) 10 MG capsule Take 1 capsule (10 mg total) by mouth 4 (four) times daily -  before meals and at bedtime. Patient taking differently: Take 10 mg by mouth 3 (three) times daily with meals as needed.  09/01/17  Yes Lucille Passy, MD  diphenhydrAMINE (BENADRYL) 12.5 MG/5ML liquid Take 12.5 mg by mouth 4 (four) times daily as needed for itching or allergies.    Yes [provider]  fexofenadine (ALLEGRA) 180 MG tablet Take 1 tablet (180 mg total) by mouth daily. Patient taking differently: Take 180 mg by mouth daily as needed for allergies.  05/12/17  Yes Lucille Passy, MD  FLUoxetine (PROZAC) 40 MG capsule Take 1 capsule (40 mg total) by mouth daily. 06/22/17  Yes Lucille Passy, MD  GLUCOSAMINE-CHONDROITIN PO Take 2 tablets daily by mouth.   Yes [provider]  hydrOXYzine (VISTARIL) 25 MG capsule Take 1 capsule (25 mg total) by mouth 3 (three) times daily as needed for itching. 10/28/17  Yes Lucille Passy, MD  lisdexamfetamine (VYVANSE) 70 MG capsule Take 1 capsule (70 mg total) by mouth daily. 10/25/17  Yes Lucille Passy, MD  predniSONE (DELTASONE) 20 MG tablet 3 tabs by mouth daily x 4 days, 2 tabs by mouth daily x 3 days, 1 tab by mouth daily x 3 days, 1/2 tab by mouth daily x 3 days 10/29/17  Yes Lucille Passy, MD  pregabalin (LYRICA) 200 MG capsule Take 1 capsule (200 mg total) by mouth 2 (two) times daily. 10/05/17  Yes Lucille Passy, MD  TURMERIC PO Take 1 tablet daily by mouth.   Yes [provider]  aluminum sulfate-calcium acetate (DOMEBORO) packet Apply 1 packet topically 3 (three) times daily. 10/31/17   Noemi Chapel, MD  triamcinolone (KENALOG) 0.025 % ointment Apply 1 application topically 2 (two) times daily. Do not apply to face 10/31/17   Noemi Chapel, MD    Family History Family History  Problem Relation Age of Onset  . Uterine cancer Mother   . Pulmonary embolism Mother   . Anxiety disorder Mother   . Depression Mother   . Hyperlipidemia Mother   . Breast cancer Unknown        grandmother  . Diabetes Father   . Bipolar disorder Sister   . Hypersomnolence Sister   . Chiari malformation Sister   . Chiari malformation Brother   . Lumbar disc disease Brother   . Polymyalgia rheumatica Maternal Aunt   . ADD / ADHD Son   . Breast cancer Maternal Grandmother   . Clotting disorder Maternal Grandfather    . Lupus Paternal Grandmother   . Heart attack Paternal Grandfather     Social History Social History   Tobacco Use  . Smoking status: Former Smoker    Types: E-cigarettes  . Smokeless tobacco: Never Used  Substance Use Topics  . Alcohol use: Yes    Comment: occasional-states 2 month  . Drug use: Never     Allergies   Other; Advair diskus [fluticasone-salmeterol]; and Gabapentin   Review of Systems Review of Systems  All other systems reviewed and are negative.    Physical Exam Updated Vital Signs BP (!) 128/57 (BP Location: Right Arm)  Pulse (!) 51   Temp 98.2 F (36.8 C) (Oral)   Resp 17   Ht 1.702 m (5\' 7" )   Wt 112.5 kg   SpO2 94%   BMI 38.85 kg/m   Physical Exam  Constitutional: She appears well-developed and well-nourished. No distress.  HENT:  Head: Normocephalic and atraumatic.  Mouth/Throat: Oropharynx is clear and moist. No oropharyngeal exudate.  There are some small vesicular lesions and periorbital swelling especially on the left, other rash scattered across the face and the upper neck and back.  Phonation is normal, there is no swelling of the throat, nasal passages appear clear, mucous membranes are moist  Eyes: Pupils are equal, round, and reactive to light. Conjunctivae and EOM are normal. Right eye exhibits no discharge. Left eye exhibits no discharge. No scleral icterus.  Neck: Normal range of motion. Neck supple. No JVD present. No thyromegaly present.  Cardiovascular: Normal rate, regular rhythm, normal heart sounds and intact distal pulses. Exam reveals no gallop and no friction rub.  No murmur heard. Pulmonary/Chest: Effort normal and breath sounds normal. No respiratory distress. She has no wheezes. She has no rales.  Abdominal: Soft. Bowel sounds are normal. She exhibits no distension and no mass. There is no tenderness.  Musculoskeletal: Normal range of motion. She exhibits no edema or tenderness.  Lymphadenopathy:    She has no  cervical adenopathy.  Neurological: She is alert. Coordination normal.  Skin: Skin is warm and dry. Rash noted. There is erythema.  Poison ivy type rash scattered across the bilateral upper extremities, the upper back and lower neck, the face as described above  Psychiatric: She has a normal mood and affect. Her behavior is normal.  Nursing note and vitals reviewed.    ED Treatments / Results  Labs (all labs ordered are listed, but only abnormal results are displayed) Labs Reviewed - No data to display  EKG EKG Interpretation  Date/Time:  Saturday October 31 2017 08:48:22 EDT Ventricular Rate:  56 PR Interval:    QRS Duration: 87 QT Interval:  431 QTC Calculation: 416 R Axis:   40 Text Interpretation:  Sinus rhythm Low voltage, precordial leads since last tracing no significant change Confirmed by Noemi Chapel [phone]) on 10/31/2017 8:54:53 AM Also confirmed by Noemi Chapel [phone]), editor Hattie Perch ([id number])  on 10/31/2017 11:20:51 AM   Radiology No results found.  Procedures Procedures (including critical care time)  Medications Ordered in ED Medications  dexamethasone (DECADRON) injection 10 mg (10 mg Intramuscular Given 10/31/17 0844)     Initial Impression / Assessment and Plan / ED Course  I have reviewed the triage vital signs and the nursing notes.  Pertinent labs & imaging results that were available during my care of the patient were reviewed by me and considered in my medical decision making (see chart for details).     There is no petechia, no purpura, she does appear to have a poison ivy type vesicular rash with some linear areas but also some groupings consistent with severe poison ivy exposure.  Her conjunctive are clear, pupils are equal round and reactive, there is no signs of ocular involvement.  She is already taking prednisone, antihistamines, though she is getting some intermittent relief she continues to have swelling.  She is concerned  because of the nasal and pharyngeal congestion and feeling like obstruction however there is no objective findings.  Will get a shot of Decadron, observed in the emergency department for a couple hours to make sure she  does not worsen.  Overall the patient does appear stable and it seems like her pulmonary symptoms of shortness of breath especially on exertion are more related to her allergies and less related to any type of obstructive disease.  We will obtain an EKG to make sure there are no abnormalities.  The patient was reevaluated a couple of times, each time she continued to have slight improvement, she is ambulatory, she does not appear dyspneic on exertion, her EKG is unremarkable, the patient is now feeling better, steady for discharge, she has a prednisone taper which was she will continue to take, she has Vistaril, she has close follow-up with the family doctor.  Stable for discharge  Had a Domeboro as well as triamcinolone for additional relief  Final Clinical Impressions(s) / ED Diagnoses   Final diagnoses:  Poison ivy dermatitis    ED Discharge Orders         Ordered    triamcinolone (KENALOG) 0.025 % ointment  2 times daily     10/31/17 1130    aluminum sulfate-calcium acetate (DOMEBORO) packet  3 times daily     10/31/17 1132           Noemi Chapel, MD 10/31/17 1134

## 2017-10-31 NOTE — ED Notes (Signed)
Pt verbalizes understanding of d/c instructions. Prescriptions reviewed with patient. Pt ambulatory at d/c with all belongings and with family.   

## 2017-10-31 NOTE — ED Triage Notes (Signed)
Pt states she was diagnosed with poison oak on Wednesday.  Taking Prednisone, Benadryl, and Hydroxyzine.  Increased rash with swelling, redness, and itching to face since yesterday.  Also reports feeling SOB.  Speaking in complete sentences.

## 2017-10-31 NOTE — Discharge Instructions (Addendum)
Continue your prednisone Triamcinalone as needed twice daily for local itching/  rash - avoid use on the face. Domeboro - to the back / arms - may help Vistaril OR benadryl for itching Family doctor in 2 days for recheck  ER for worsening symptoms especially any difficulty with breathing, any chest pain, feeling like you are going to pass out or any other severe or worsening symptoms

## 2017-11-01 ENCOUNTER — Other Ambulatory Visit: Payer: Self-pay | Admitting: Family Medicine

## 2017-11-01 MED ORDER — CLOBETASOL PROPIONATE 0.05 % EX CREA: 1.0000 "application " | TOPICAL_CREAM | Freq: Two times a day (BID) | CUTANEOUS | 0 refills | Status: DC

## 2017-11-04 ENCOUNTER — Other Ambulatory Visit: Payer: Self-pay | Admitting: Family Medicine

## 2017-11-04 MED ORDER — HYDROXYZINE PAMOATE 50 MG PO CAPS: 50.0000 mg | ORAL_CAPSULE | Freq: Three times a day (TID) | ORAL | 0 refills | Status: DC | PRN

## 2017-11-04 MED ORDER — PREGABALIN 200 MG PO CAPS: 200.0000 mg | ORAL_CAPSULE | Freq: Two times a day (BID) | ORAL | 3 refills | Status: DC

## 2017-11-09 ENCOUNTER — Encounter (HOSPITAL_COMMUNITY): Payer: Self-pay | Admitting: Emergency Medicine

## 2017-11-09 ENCOUNTER — Emergency Department (HOSPITAL_COMMUNITY): Payer: 59

## 2017-11-09 ENCOUNTER — Other Ambulatory Visit: Payer: Self-pay

## 2017-11-09 ENCOUNTER — Emergency Department (HOSPITAL_COMMUNITY)
Admission: EM | Admit: 2017-11-09 | Discharge: 2017-11-10 | Disposition: A | Discharge status: Home / Self Care | Payer: 59 | Attending: Emergency Medicine | Admitting: Emergency Medicine

## 2017-11-09 DIAGNOSIS — M545 Low back pain: Secondary | ICD-10-CM | POA: Insufficient documentation

## 2017-11-09 DIAGNOSIS — Z87891 Personal history of nicotine dependence: Secondary | ICD-10-CM | POA: Diagnosis not present

## 2017-11-09 DIAGNOSIS — R0902 Hypoxemia: Secondary | ICD-10-CM | POA: Diagnosis not present

## 2017-11-09 DIAGNOSIS — R51 Headache: Secondary | ICD-10-CM | POA: Diagnosis not present

## 2017-11-09 DIAGNOSIS — R109 Unspecified abdominal pain: Secondary | ICD-10-CM | POA: Insufficient documentation

## 2017-11-09 DIAGNOSIS — S59902A Unspecified injury of left elbow, initial encounter: Secondary | ICD-10-CM | POA: Diagnosis not present

## 2017-11-09 DIAGNOSIS — Z79899 Other long term (current) drug therapy: Secondary | ICD-10-CM | POA: Insufficient documentation

## 2017-11-09 DIAGNOSIS — S299XXA Unspecified injury of thorax, initial encounter: Secondary | ICD-10-CM | POA: Diagnosis not present

## 2017-11-09 DIAGNOSIS — F909 Attention-deficit hyperactivity disorder, unspecified type: Secondary | ICD-10-CM | POA: Diagnosis not present

## 2017-11-09 DIAGNOSIS — R52 Pain, unspecified: Secondary | ICD-10-CM

## 2017-11-09 DIAGNOSIS — M25522 Pain in left elbow: Secondary | ICD-10-CM | POA: Diagnosis not present

## 2017-11-09 DIAGNOSIS — S3991XA Unspecified injury of abdomen, initial encounter: Secondary | ICD-10-CM | POA: Diagnosis not present

## 2017-11-09 DIAGNOSIS — M549 Dorsalgia, unspecified: Secondary | ICD-10-CM

## 2017-11-09 DIAGNOSIS — S199XXA Unspecified injury of neck, initial encounter: Secondary | ICD-10-CM | POA: Diagnosis not present

## 2017-11-09 DIAGNOSIS — S0990XA Unspecified injury of head, initial encounter: Secondary | ICD-10-CM | POA: Diagnosis not present

## 2017-11-09 DIAGNOSIS — M546 Pain in thoracic spine: Secondary | ICD-10-CM | POA: Diagnosis not present

## 2017-11-09 LAB — COMPREHENSIVE METABOLIC PANEL
ALBUMIN: 3.5 g/dL (ref 3.5–5.0)
ALK PHOS: 68 U/L (ref 38–126)
ALT: 27 U/L (ref 0–44)
AST: 52 U/L — ABNORMAL HIGH (ref 15–41)
Anion gap: 7 (ref 5–15)
BILIRUBIN TOTAL: 1.5 mg/dL — AB (ref 0.3–1.2)
BUN: 13 mg/dL (ref 6–20)
CALCIUM: 8.8 mg/dL — AB (ref 8.9–10.3)
CO2: 29 mmol/L (ref 22–32)
Chloride: 98 mmol/L (ref 98–111)
Creatinine, Ser: 0.7 mg/dL (ref 0.44–1.00)
GLUCOSE: 77 mg/dL (ref 70–99)
Potassium: 6.2 mmol/L — ABNORMAL HIGH (ref 3.5–5.1)
Sodium: 134 mmol/L — ABNORMAL LOW (ref 135–145)
TOTAL PROTEIN: 5.7 g/dL — AB (ref 6.5–8.1)

## 2017-11-09 LAB — CBC
HCT: 40.3 % (ref 36.0–46.0)
Hemoglobin: 13.3 g/dL (ref 12.0–15.0)
MCH: 31.4 pg (ref 26.0–34.0)
MCHC: 33 g/dL (ref 30.0–36.0)
MCV: 95 fL (ref 80.0–100.0)
PLATELETS: 232 10*3/uL (ref 150–400)
RBC: 4.24 MIL/uL (ref 3.87–5.11)
RDW: 13.8 % (ref 11.5–15.5)
WBC: 11.6 10*3/uL — AB (ref 4.0–10.5)
nRBC: 0 % (ref 0.0–0.2)

## 2017-11-09 LAB — URINALYSIS, ROUTINE W REFLEX MICROSCOPIC
Bilirubin Urine: NEGATIVE
GLUCOSE, UA: NEGATIVE mg/dL
HGB URINE DIPSTICK: NEGATIVE
KETONES UR: NEGATIVE mg/dL
LEUKOCYTES UA: NEGATIVE
Nitrite: NEGATIVE
PROTEIN: NEGATIVE mg/dL
Specific Gravity, Urine: 1.008 (ref 1.005–1.030)
pH: 7 (ref 5.0–8.0)

## 2017-11-09 LAB — CDS SEROLOGY

## 2017-11-09 LAB — ETHANOL

## 2017-11-09 LAB — POTASSIUM: POTASSIUM: 3.9 mmol/L (ref 3.5–5.1)

## 2017-11-09 LAB — PROTIME-INR
INR: 0.94
Prothrombin Time: 12.5 seconds (ref 11.4–15.2)

## 2017-11-09 LAB — SAMPLE TO BLOOD BANK

## 2017-11-09 LAB — I-STAT BETA HCG BLOOD, ED (MC, WL, AP ONLY): I-stat hCG, quantitative: 11.5 m[IU]/mL — ABNORMAL HIGH (ref <5)

## 2017-11-09 LAB — I-STAT CG4 LACTIC ACID, ED: Lactic Acid, Venous: 1.21 mmol/L (ref 0.5–1.9)

## 2017-11-09 MED ORDER — HYDROCODONE-ACETAMINOPHEN 5-325 MG PO TABS
1.0000 | ORAL_TABLET | Freq: Once | ORAL | Status: DC
  Filled 2017-11-09: qty 1

## 2017-11-09 MED ORDER — OXYCODONE-ACETAMINOPHEN 5-325 MG PO TABS: 1.0000 | ORAL_TABLET | Freq: Four times a day (QID) | ORAL | 0 refills | Status: AC | PRN

## 2017-11-09 MED ORDER — IBUPROFEN 400 MG PO TABS
600.0000 mg | ORAL_TABLET | Freq: Once | ORAL | Status: AC
  Administered 2017-11-09: 600 mg via ORAL
  Filled 2017-11-09: qty 1

## 2017-11-09 MED ORDER — MORPHINE SULFATE (PF) 4 MG/ML IV SOLN
4.0000 mg | Freq: Once | INTRAVENOUS | Status: AC
  Administered 2017-11-09: 4 mg via INTRAVENOUS
  Filled 2017-11-09: qty 1

## 2017-11-09 MED ORDER — IOHEXOL 300 MG/ML  SOLN
125.0000 mL | Freq: Once | INTRAMUSCULAR | Status: AC | PRN
  Administered 2017-11-09: 125 mL via INTRAVENOUS

## 2017-11-09 MED ORDER — TETANUS-DIPHTH-ACELL PERTUSSIS 5-2.5-18.5 LF-MCG/0.5 IM SUSP: 0.5000 mL | Freq: Once | INTRAMUSCULAR | Status: DC

## 2017-11-09 MED ORDER — METHOCARBAMOL 500 MG PO TABS
1000.0000 mg | ORAL_TABLET | Freq: Once | ORAL | Status: AC
  Administered 2017-11-09: 1000 mg via ORAL
  Filled 2017-11-09: qty 2

## 2017-11-09 MED ORDER — OXYCODONE-ACETAMINOPHEN 5-325 MG PO TABS
1.0000 | ORAL_TABLET | Freq: Once | ORAL | Status: AC
  Administered 2017-11-09: 1 via ORAL
  Filled 2017-11-09: qty 1
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
## 2017-11-09 NOTE — ED Notes (Signed)
Patient transported to X-ray 

## 2017-11-09 NOTE — ED Notes (Signed)
Patient transported to Ultrasound 

## 2017-11-09 NOTE — ED Notes (Signed)
Patient left at this time with all belongings. 

## 2017-11-09 NOTE — ED Notes (Signed)
I Stat Chem 8 K of 6.6 reported to Dr. Minerva Ends
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
## 2017-11-10 ENCOUNTER — Telehealth: Payer: Self-pay | Admitting: Family Medicine

## 2017-11-10 ENCOUNTER — Other Ambulatory Visit: Payer: Self-pay | Admitting: Family Medicine

## 2017-11-10 LAB — I-STAT CHEM 8, ED
BUN: 20 mg/dL (ref 6–20)
CALCIUM ION: 1.05 mmol/L — AB (ref 1.15–1.40)
CHLORIDE: 101 mmol/L (ref 98–111)
CREATININE: 0.6 mg/dL (ref 0.44–1.00)
GLUCOSE: 80 mg/dL (ref 70–99)
HCT: 41 % (ref 36.0–46.0)
Hemoglobin: 13.9 g/dL (ref 12.0–15.0)
Potassium: 6.6 mmol/L (ref 3.5–5.1)
Sodium: 135 mmol/L (ref 135–145)
TCO2: 33 mmol/L — ABNORMAL HIGH (ref 22–32)

## 2017-11-10 MED ORDER — CARISOPRODOL 250 MG PO TABS: 350.0000 mg | ORAL_TABLET | Freq: Four times a day (QID) | ORAL | 0 refills | Status: DC

## 2017-11-10 MED ORDER — CARISOPRODOL 350 MG PO TABS: 350.0000 mg | ORAL_TABLET | Freq: Four times a day (QID) | ORAL | 3 refills | Status: DC | PRN
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
## 2017-11-16 ENCOUNTER — Telehealth: Payer: Self-pay | Admitting: Family Medicine

## 2017-11-16 ENCOUNTER — Ambulatory Visit (INDEPENDENT_AMBULATORY_CARE_PROVIDER_SITE_OTHER): Payer: 59 | Admitting: Family Medicine

## 2017-11-16 DIAGNOSIS — F0781 Postconcussional syndrome: Secondary | ICD-10-CM | POA: Diagnosis not present

## 2017-11-16 DIAGNOSIS — F4310 Post-traumatic stress disorder, unspecified: Secondary | ICD-10-CM | POA: Diagnosis not present

## 2017-11-16 DIAGNOSIS — S060X0A Concussion without loss of consciousness, initial encounter: Secondary | ICD-10-CM | POA: Diagnosis not present

## 2017-11-16 MED ORDER — PRAZOSIN HCL 1 MG PO CAPS: 1.0000 mg | ORAL_CAPSULE | Freq: Two times a day (BID) | ORAL | 0 refills | Status: DC

## 2017-11-16 MED FILL — ALPRAZolam 2 MG TABS: 2 | 30 days supply | Qty: 90 | Fill #0

## 2017-11-16 MED FILL — PRAZOSIN 1 MG CAPSULE: 1 | 15 days supply | Qty: 30 | Fill #0

## 2017-11-16 NOTE — Assessment & Plan Note (Signed)
Advised to go home today, take tomorrow off.  Go back to strict brain rest. I am hoping that if she can get some sleep, she can actually rest her brain. See under PTSD.
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
## 2017-11-16 NOTE — Assessment & Plan Note (Signed)
Now with post concussive symptoms.
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
## 2017-11-18 ENCOUNTER — Other Ambulatory Visit: Payer: Self-pay | Admitting: Family Medicine

## 2017-11-18 ENCOUNTER — Ambulatory Visit (INDEPENDENT_AMBULATORY_CARE_PROVIDER_SITE_OTHER): Payer: 59 | Admitting: Family Medicine

## 2017-11-18 ENCOUNTER — Encounter: Payer: Self-pay | Admitting: Family Medicine

## 2017-11-18 VITALS — BP 132/80 | HR 78 | Temp 98.7°F | Ht 67.0 in | Wt 259.9 lb

## 2017-11-18 DIAGNOSIS — F0781 Postconcussional syndrome: Secondary | ICD-10-CM

## 2017-11-18 DIAGNOSIS — F4310 Post-traumatic stress disorder, unspecified: Secondary | ICD-10-CM | POA: Diagnosis not present

## 2017-11-18 MED ORDER — LISDEXAMFETAMINE DIMESYLATE 70 MG PO CAPS: 70.0000 mg | ORAL_CAPSULE | Freq: Every day | ORAL | 0 refills | Status: DC

## 2017-11-18 MED ORDER — AMPHETAMINE-DEXTROAMPHETAMINE 30 MG PO TABS: 30.0000 mg | ORAL_TABLET | Freq: Three times a day (TID) | ORAL | 0 refills | Status: DC

## 2017-11-18 NOTE — Assessment & Plan Note (Signed)
Symptoms currently resolved.  She is cleared to work. I did tell her that if her symptoms return during work today, to let me know and I will send her home.

## 2017-11-18 NOTE — Assessment & Plan Note (Signed)
Improved with minipress at bedtime.  No longer having flashbacks or nightmares. Able to sleep. Keep follow up as scheduled with her pyschiatrist later this month.  She is also seeing a psychotherapist.
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
## 2017-11-25 ENCOUNTER — Other Ambulatory Visit: Payer: Self-pay | Admitting: Family Medicine

## 2017-11-25 MED ORDER — LISDEXAMFETAMINE DIMESYLATE 70 MG PO CAPS: 70.0000 mg | ORAL_CAPSULE | Freq: Every day | ORAL | 0 refills | Status: DC

## 2017-12-07 ENCOUNTER — Other Ambulatory Visit: Payer: Self-pay | Admitting: Family Medicine

## 2017-12-07 MED ORDER — AMPHETAMINE-DEXTROAMPHETAMINE 30 MG PO TABS: 30.0000 mg | ORAL_TABLET | Freq: Three times a day (TID) | ORAL | 0 refills | Status: DC

## 2017-12-07 MED ORDER — LISDEXAMFETAMINE DIMESYLATE 70 MG PO CAPS: 70.0000 mg | ORAL_CAPSULE | Freq: Every day | ORAL | 0 refills | Status: DC

## 2017-12-07 MED FILL — VYVANSE 70 MG CAPSULE: 70 | 30 days supply | Qty: 30 | Fill #0

## 2017-12-07 MED FILL — DEXTROAMPH TB 30MG NSTR 100: 30 | 30 days supply | Qty: 90 | Fill #0

## 2017-12-08 ENCOUNTER — Encounter: Payer: Self-pay | Admitting: Family Medicine

## 2017-12-14 ENCOUNTER — Other Ambulatory Visit: Payer: Self-pay | Admitting: Family Medicine

## 2017-12-14 MED ORDER — ALPRAZOLAM 2 MG PO TABS: 2.0000 mg | ORAL_TABLET | Freq: Three times a day (TID) | ORAL | 0 refills | Status: DC | PRN

## 2017-12-14 MED ORDER — PRAZOSIN HCL 1 MG PO CAPS: 1.0000 mg | ORAL_CAPSULE | Freq: Two times a day (BID) | ORAL | 1 refills | Status: DC

## 2017-12-14 MED FILL — PRAZOSIN 1 MG CAPSULE: 1 | 23 days supply | Qty: 45 | Fill #0

## 2017-12-14 MED FILL — ALPRAZolam 2 MG TABS: 2 | 30 days supply | Qty: 90 | Fill #0

## 2017-12-18 ENCOUNTER — Other Ambulatory Visit: Payer: Self-pay | Admitting: Family Medicine

## 2017-12-18 MED ORDER — METFORMIN HCL 500 MG PO TABS: 500.0000 mg | ORAL_TABLET | Freq: Every day | ORAL | 3 refills | Status: DC

## 2017-12-28 ENCOUNTER — Other Ambulatory Visit: Payer: Self-pay | Admitting: Family Medicine

## 2017-12-28 MED ORDER — ALBUTEROL SULFATE HFA 108 (90 BASE) MCG/ACT IN AERS: 2.0000 | INHALATION_SPRAY | Freq: Four times a day (QID) | RESPIRATORY_TRACT | 0 refills | Status: DC | PRN

## 2017-12-28 MED ORDER — ALPRAZOLAM 2 MG PO TABS: 2.0000 mg | ORAL_TABLET | Freq: Three times a day (TID) | ORAL | 0 refills | Status: DC | PRN

## 2017-12-28 MED ORDER — LISDEXAMFETAMINE DIMESYLATE 70 MG PO CAPS: 70.0000 mg | ORAL_CAPSULE | Freq: Every day | ORAL | 0 refills | Status: DC

## 2017-12-28 MED ORDER — AMPHETAMINE-DEXTROAMPHETAMINE 30 MG PO TABS: 30.0000 mg | ORAL_TABLET | Freq: Three times a day (TID) | ORAL | 0 refills | Status: DC

## 2017-12-28 MED FILL — VENTOLIN HFA 90 MCG INHALER: 108 (90 BAS | 25 days supply | Qty: 18 | Fill #0

## 2018-01-01 ENCOUNTER — Other Ambulatory Visit: Payer: Self-pay | Admitting: Family Medicine

## 2018-01-01 ENCOUNTER — Telehealth: Payer: Self-pay | Admitting: Family Medicine

## 2018-01-01 MED ORDER — CLINDAMYCIN HCL 300 MG PO CAPS: 600.0000 mg | ORAL_CAPSULE | Freq: Three times a day (TID) | ORAL | 0 refills | Status: AC

## 2018-01-01 MED FILL — CLINDAMYCIN HCL 300 MG CAPS: 300 | 10 days supply | Qty: 60 | Fill #0

## 2018-01-01 NOTE — Telephone Encounter (Signed)
Pt has dental abscess.  Making appt with dentist.  eRx sent for clindamycin to pharmacy until she can be seen.

## 2018-01-03 ENCOUNTER — Other Ambulatory Visit: Payer: Self-pay | Admitting: Family Medicine

## 2018-01-03 MED ORDER — AMOXICILLIN-POT CLAVULANATE 875-125 MG PO TABS: 1.0000 | ORAL_TABLET | Freq: Two times a day (BID) | ORAL | 0 refills | Status: AC

## 2018-01-07 ENCOUNTER — Telehealth: Payer: Self-pay | Admitting: Family Medicine

## 2018-01-07 MED ORDER — PREDNISONE 20 MG PO TABS: ORAL_TABLET | ORAL | 0 refills | Status: DC

## 2018-01-07 MED FILL — VYVANSE 70 MG CAPSULE: 70 | 30 days supply | Qty: 30 | Fill #0

## 2018-01-07 MED FILL — DEXTROAMPH TB 30MG NSTR 100: 30 | 30 days supply | Qty: 90 | Fill #0

## 2018-01-07 MED FILL — predniSONE 20 MG TABS: 20 | 16 days supply | Qty: 26 | Fill #0

## 2018-01-07 NOTE — Telephone Encounter (Signed)
Pt woke up with facial swelling and and face burning warmth similar to previous allergies to plants.  Was outside yesterday.  IM depo 40 mg x 1 given today, Will send extended oral prednisone taper to start tomorrow.

## 2018-01-12 MED FILL — ALPRAZolam 2 MG TABS: 2 | 30 days supply | Qty: 90 | Fill #0

## 2018-01-27 ENCOUNTER — Other Ambulatory Visit: Payer: Self-pay | Admitting: Family Medicine

## 2018-01-27 MED ORDER — CLONAZEPAM 1 MG PO TABS: 1.0000 mg | ORAL_TABLET | Freq: Three times a day (TID) | ORAL | 1 refills | Status: DC | PRN

## 2018-01-27 NOTE — Progress Notes (Signed)
Pt having increased stress due to work and marriage issues- will attempt klonipin bridge. Therapist will be made aware.

## 2018-02-01 ENCOUNTER — Other Ambulatory Visit: Payer: Self-pay | Admitting: Family Medicine

## 2018-02-01 MED ORDER — LISDEXAMFETAMINE DIMESYLATE 70 MG PO CAPS: 70.0000 mg | ORAL_CAPSULE | Freq: Every day | ORAL | 0 refills | Status: DC

## 2018-02-01 MED ORDER — LYRICA 150 MG PO CAPS: 150.0000 mg | ORAL_CAPSULE | Freq: Two times a day (BID) | ORAL | 1 refills | Status: DC

## 2018-02-01 MED ORDER — AMPHETAMINE-DEXTROAMPHETAMINE 30 MG PO TABS: 30.0000 mg | ORAL_TABLET | Freq: Three times a day (TID) | ORAL | 0 refills | Status: DC

## 2018-02-01 MED ORDER — ALPRAZOLAM 2 MG PO TABS: 2.0000 mg | ORAL_TABLET | Freq: Three times a day (TID) | ORAL | 0 refills | Status: DC | PRN

## 2018-02-08 ENCOUNTER — Other Ambulatory Visit: Payer: Self-pay | Admitting: Family Medicine

## 2018-02-08 ENCOUNTER — Telehealth: Payer: Self-pay | Admitting: Family Medicine

## 2018-02-08 MED ORDER — LISDEXAMFETAMINE DIMESYLATE 70 MG PO CAPS: 70.0000 mg | ORAL_CAPSULE | Freq: Every day | ORAL | 0 refills | Status: DC

## 2018-02-08 MED ORDER — CARISOPRODOL 350 MG PO TABS: 350.0000 mg | ORAL_TABLET | Freq: Four times a day (QID) | ORAL | 5 refills | Status: DC | PRN

## 2018-02-08 MED ORDER — ALPRAZOLAM 2 MG PO TABS: 2.0000 mg | ORAL_TABLET | Freq: Three times a day (TID) | ORAL | 0 refills | Status: DC | PRN

## 2018-02-08 MED ORDER — CLARITHROMYCIN 500 MG PO TABS: 500.0000 mg | ORAL_TABLET | Freq: Two times a day (BID) | ORAL | 0 refills | Status: DC

## 2018-02-08 NOTE — Telephone Encounter (Signed)
Med list updated

## 2018-02-09 ENCOUNTER — Other Ambulatory Visit: Payer: Self-pay | Admitting: Family Medicine

## 2018-02-09 MED ORDER — AMPHETAMINE-DEXTROAMPHETAMINE 30 MG PO TABS: 30.0000 mg | ORAL_TABLET | Freq: Three times a day (TID) | ORAL | 0 refills | Status: DC

## 2018-02-09 MED ORDER — OSELTAMIVIR PHOSPHATE 75 MG PO CAPS: 75.0000 mg | ORAL_CAPSULE | Freq: Every day | ORAL | 0 refills | Status: DC

## 2018-03-02 ENCOUNTER — Other Ambulatory Visit: Payer: Self-pay | Admitting: Family Medicine

## 2018-03-02 MED ORDER — ALPRAZOLAM 2 MG PO TABS: 2.0000 mg | ORAL_TABLET | Freq: Three times a day (TID) | ORAL | 0 refills | Status: DC | PRN

## 2018-03-02 MED ORDER — AMPHETAMINE-DEXTROAMPHETAMINE 30 MG PO TABS: 30.0000 mg | ORAL_TABLET | Freq: Three times a day (TID) | ORAL | 0 refills | Status: DC

## 2018-03-10 ENCOUNTER — Other Ambulatory Visit: Payer: Self-pay | Admitting: Family Medicine

## 2018-03-10 MED ORDER — PRAZOSIN HCL 1 MG PO CAPS: 1.0000 mg | ORAL_CAPSULE | Freq: Two times a day (BID) | ORAL | 1 refills | Status: DC

## 2018-03-10 MED ORDER — LISDEXAMFETAMINE DIMESYLATE 70 MG PO CAPS: 70.0000 mg | ORAL_CAPSULE | Freq: Every day | ORAL | 0 refills | Status: DC

## 2018-03-21 ENCOUNTER — Other Ambulatory Visit: Payer: Self-pay | Admitting: Family Medicine

## 2018-03-21 DIAGNOSIS — J45901 Unspecified asthma with (acute) exacerbation: Secondary | ICD-10-CM

## 2018-03-21 MED ORDER — LEVALBUTEROL HCL 0.63 MG/3ML IN NEBU: 0.6300 mg | INHALATION_SOLUTION | Freq: Once | RESPIRATORY_TRACT | 12 refills | Status: DC

## 2018-03-26 ENCOUNTER — Telehealth: Payer: Self-pay | Admitting: Family Medicine

## 2018-03-26 MED ORDER — OXYCODONE HCL 10 MG PO TABS: 10.0000 mg | ORAL_TABLET | Freq: Four times a day (QID) | ORAL | 0 refills | Status: DC | PRN

## 2018-03-26 MED ORDER — LISDEXAMFETAMINE DIMESYLATE 70 MG PO CAPS: 70.0000 mg | ORAL_CAPSULE | Freq: Every day | ORAL | 0 refills | Status: DC

## 2018-03-26 MED ORDER — ALPRAZOLAM 2 MG PO TABS: 2.0000 mg | ORAL_TABLET | Freq: Three times a day (TID) | ORAL | 0 refills | Status: DC | PRN

## 2018-03-26 MED ORDER — OXYCODONE HCL 7.5 MG PO TABS: ORAL_TABLET | ORAL | 0 refills | Status: DC

## 2018-03-26 MED ORDER — AMPHETAMINE-DEXTROAMPHETAMINE 30 MG PO TABS: 30.0000 mg | ORAL_TABLET | Freq: Three times a day (TID) | ORAL | 0 refills | Status: DC

## 2018-03-26 MED FILL — oxyCODONE HCL 10 MG TABS: 10 | 7 days supply | Qty: 20 | Fill #0

## 2018-03-26 NOTE — Telephone Encounter (Signed)
Spoke with patient.  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  New back pain (known severe lower back issues) already on  Lyrica.  She is trying to get in with ortho.  She is asking for short course of oxycodone for severe pain which is reasonable.  eRx sent.  She will keep me updated. PDMP reviewed.

## 2018-03-29 ENCOUNTER — Other Ambulatory Visit: Payer: Self-pay | Admitting: Family Medicine

## 2018-03-29 MED ORDER — AZITHROMYCIN 250 MG PO TABS: ORAL_TABLET | ORAL | 1 refills | Status: DC

## 2018-04-12 ENCOUNTER — Other Ambulatory Visit: Payer: Self-pay | Admitting: Family Medicine

## 2018-04-12 MED ORDER — LISDEXAMFETAMINE DIMESYLATE 70 MG PO CAPS: 70.0000 mg | ORAL_CAPSULE | Freq: Every day | ORAL | 0 refills | Status: DC

## 2018-04-12 MED ORDER — LYRICA 150 MG PO CAPS: 150.0000 mg | ORAL_CAPSULE | Freq: Two times a day (BID) | ORAL | 1 refills | Status: DC

## 2018-04-12 MED ORDER — CARISOPRODOL 350 MG PO TABS: 350.0000 mg | ORAL_TABLET | Freq: Four times a day (QID) | ORAL | 0 refills | Status: DC | PRN

## 2018-04-21 ENCOUNTER — Other Ambulatory Visit: Payer: Self-pay | Admitting: Family Medicine

## 2018-04-24 ENCOUNTER — Other Ambulatory Visit: Payer: Self-pay | Admitting: Family Medicine

## 2018-04-24 MED ORDER — AMPHETAMINE-DEXTROAMPHETAMINE 30 MG PO TABS: 30.0000 mg | ORAL_TABLET | Freq: Three times a day (TID) | ORAL | 0 refills | Status: DC

## 2018-04-24 MED ORDER — ALPRAZOLAM 2 MG PO TABS: 2.0000 mg | ORAL_TABLET | Freq: Three times a day (TID) | ORAL | 0 refills | Status: DC | PRN

## 2018-04-26 DIAGNOSIS — J45901 Unspecified asthma with (acute) exacerbation: Secondary | ICD-10-CM | POA: Diagnosis not present

## 2018-05-03 ENCOUNTER — Other Ambulatory Visit: Payer: Self-pay | Admitting: Family Medicine

## 2018-05-03 MED ORDER — ALPRAZOLAM 2 MG PO TABS: 2.0000 mg | ORAL_TABLET | Freq: Three times a day (TID) | ORAL | 0 refills | Status: DC | PRN

## 2018-05-03 MED ORDER — AMPHETAMINE-DEXTROAMPHETAMINE 30 MG PO TABS: 30.0000 mg | ORAL_TABLET | Freq: Three times a day (TID) | ORAL | 0 refills | Status: DC

## 2018-05-18 ENCOUNTER — Other Ambulatory Visit: Payer: Self-pay | Admitting: Family Medicine

## 2018-05-18 MED ORDER — METHYLPHENIDATE HCL ER (OSM) 54 MG PO TBCR: 54.0000 mg | EXTENDED_RELEASE_TABLET | ORAL | 0 refills | Status: DC

## 2018-05-19 ENCOUNTER — Encounter: Payer: Self-pay | Admitting: Family Medicine

## 2018-05-19 ENCOUNTER — Ambulatory Visit (INDEPENDENT_AMBULATORY_CARE_PROVIDER_SITE_OTHER): Payer: 59 | Admitting: Family Medicine

## 2018-05-19 VITALS — BP 128/76 | Ht 67.0 in | Wt 290.4 lb

## 2018-05-19 DIAGNOSIS — F419 Anxiety disorder, unspecified: Secondary | ICD-10-CM

## 2018-05-19 DIAGNOSIS — F4310 Post-traumatic stress disorder, unspecified: Secondary | ICD-10-CM | POA: Diagnosis not present

## 2018-05-19 DIAGNOSIS — F339 Major depressive disorder, recurrent, unspecified: Secondary | ICD-10-CM | POA: Diagnosis not present

## 2018-05-19 DIAGNOSIS — F988 Other specified behavioral and emotional disorders with onset usually occurring in childhood and adolescence: Secondary | ICD-10-CM

## 2018-05-19 MED ORDER — LIRAGLUTIDE -WEIGHT MANAGEMENT 18 MG/3ML ~~LOC~~ SOPN: PEN_INJECTOR | SUBCUTANEOUS | 0 refills | Status: AC

## 2018-05-19 NOTE — Assessment & Plan Note (Signed)
>  25 minutes spent in face to face time with patient, >50% spent in counselling or coordination of care discussing obesity, depression, anxiety, ADD. She recently started concerta- she will update me with how this is working.  She has not SI or HI.  I have given her names of other therapists to contact, such as Dr. Zella Ball.  Given that she does have PCOS and she feels her dramatic weight gain is at the root of her worsening anxiety and depression, we agreed to a trial of Saxenda- eRx sent to pharmacy on file. Follow up in 1 month. The patient indicates understanding of these issues and agrees with the plan.
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
## 2018-05-26 DIAGNOSIS — J45901 Unspecified asthma with (acute) exacerbation: Secondary | ICD-10-CM | POA: Diagnosis not present

## 2018-05-27 ENCOUNTER — Other Ambulatory Visit: Payer: Self-pay | Admitting: Family Medicine

## 2018-05-27 MED ORDER — AMPHETAMINE-DEXTROAMPHETAMINE 30 MG PO TABS: 30.0000 mg | ORAL_TABLET | Freq: Three times a day (TID) | ORAL | 0 refills | Status: DC

## 2018-05-27 MED ORDER — ALPRAZOLAM 2 MG PO TABS: 2.0000 mg | ORAL_TABLET | Freq: Three times a day (TID) | ORAL | 0 refills | Status: DC | PRN

## 2018-06-03 ENCOUNTER — Ambulatory Visit (INDEPENDENT_AMBULATORY_CARE_PROVIDER_SITE_OTHER): Payer: 59 | Admitting: Family Medicine

## 2018-06-03 ENCOUNTER — Other Ambulatory Visit: Payer: Self-pay

## 2018-06-03 ENCOUNTER — Ambulatory Visit (INDEPENDENT_AMBULATORY_CARE_PROVIDER_SITE_OTHER): Payer: 59

## 2018-06-03 DIAGNOSIS — F419 Anxiety disorder, unspecified: Secondary | ICD-10-CM | POA: Diagnosis not present

## 2018-06-03 DIAGNOSIS — F339 Major depressive disorder, recurrent, unspecified: Secondary | ICD-10-CM

## 2018-06-03 DIAGNOSIS — F988 Other specified behavioral and emotional disorders with onset usually occurring in childhood and adolescence: Secondary | ICD-10-CM

## 2018-06-03 DIAGNOSIS — E282 Polycystic ovarian syndrome: Secondary | ICD-10-CM | POA: Diagnosis not present

## 2018-06-03 DIAGNOSIS — R05 Cough: Secondary | ICD-10-CM | POA: Diagnosis not present

## 2018-06-03 DIAGNOSIS — F4310 Post-traumatic stress disorder, unspecified: Secondary | ICD-10-CM | POA: Diagnosis not present

## 2018-06-03 DIAGNOSIS — F3170 Bipolar disorder, currently in remission, most recent episode unspecified: Secondary | ICD-10-CM

## 2018-06-03 DIAGNOSIS — R058 Other specified cough: Secondary | ICD-10-CM

## 2018-06-03 LAB — HEMOGLOBIN A1C: Hgb A1c MFr Bld: 5.6 % (ref 4.6–6.5)

## 2018-06-03 LAB — VITAMIN D 25 HYDROXY (VIT D DEFICIENCY, FRACTURES): VITD: 28.26 ng/mL — ABNORMAL LOW (ref 30.00–100.00)

## 2018-06-03 MED ORDER — SERTRALINE HCL 50 MG PO TABS: 50.0000 mg | ORAL_TABLET | Freq: Every day | ORAL | 3 refills | Status: DC

## 2018-06-03 NOTE — Assessment & Plan Note (Signed)
Deteriorated, along with her anxiety. >40 minutes spent in face to face time with patient, >50% spent in counselling or coordination of care.  She is willing to try zoloft.  Start with 50 mg daily and she will update me in a few weeks.  She denies mania but I do want her to see a psychiatrist.  She is refusing but I told her about the EAP through work and she will call them. Follow up with me in 2-3 weeks. The patient indicates understanding of these issues and agrees with the plan.
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
## 2018-06-07 LAB — INSULIN-LIKE GROWTH FACTOR
IGF-I, LC/MS: 94 ng/mL (ref 52–328)
Z-Score (Female): -0.9 SD (ref ?–2.0)

## 2018-06-07 LAB — PROLACTIN: Prolactin: 2.7 ng/mL

## 2018-06-10 ENCOUNTER — Other Ambulatory Visit: Payer: Self-pay | Admitting: Family Medicine

## 2018-06-10 ENCOUNTER — Telehealth: Payer: Self-pay | Admitting: Family Medicine

## 2018-06-10 MED ORDER — TAMSULOSIN HCL 0.4 MG PO CAPS: 0.4000 mg | ORAL_CAPSULE | Freq: Every day | ORAL | 0 refills | Status: DC

## 2018-06-10 MED ORDER — OXYCODONE HCL 10 MG PO TABS: 10.0000 mg | ORAL_TABLET | Freq: Four times a day (QID) | ORAL | 0 refills | Status: DC | PRN

## 2018-06-10 MED ORDER — SERTRALINE HCL 100 MG PO TABS: 100.0000 mg | ORAL_TABLET | Freq: Every day | ORAL | 3 refills | Status: DC

## 2018-06-10 NOTE — Telephone Encounter (Signed)
Copied from [place] [id number]. Topic: Quick Communication - See Telephone Encounter >> Jun 10, 2018  2:02 PM Blase Mess A wrote: CRM for notification. See Telephone encounter for: 06/10/18.  [facility] [address] [place] [address] [phone] (Phone) [phone] (Fax)  Patient is requesting Oxycodone HCl 10 MG TABS [[id number]]  Pharmacy states that the patient has never had an opiod not that she can see. The MME needs to be  less than 50 or 3 times a day.

## 2018-06-10 NOTE — Telephone Encounter (Signed)
Pt having kidney stone pain, requesting oxycodone for severe pain and flomax.  Call or send my chart message prn if these symptoms worsen or fail to improve as anticipated. The patient indicates understanding of these issues and agrees with the plan.

## 2018-06-10 NOTE — Telephone Encounter (Signed)
Dr. Deborra Medina, please advise, thanks!

## 2018-06-11 MED ORDER — OXYCODONE HCL 10 MG PO TABS: 10.0000 mg | ORAL_TABLET | Freq: Three times a day (TID) | ORAL | 0 refills | Status: DC | PRN

## 2018-06-11 NOTE — Telephone Encounter (Signed)
Pharmacy was able to fix & fill Rx yesterday, patient has picked it up.

## 2018-06-11 NOTE — Telephone Encounter (Signed)
New eRx sent.  Attempted to contact patient but have not heard back yet.

## 2018-06-15 ENCOUNTER — Other Ambulatory Visit: Payer: Self-pay | Admitting: Family Medicine

## 2018-06-16 ENCOUNTER — Other Ambulatory Visit: Payer: Self-pay | Admitting: Family Medicine

## 2018-06-16 ENCOUNTER — Encounter (HOSPITAL_BASED_OUTPATIENT_CLINIC_OR_DEPARTMENT_OTHER): Payer: Self-pay

## 2018-06-16 ENCOUNTER — Ambulatory Visit (HOSPITAL_BASED_OUTPATIENT_CLINIC_OR_DEPARTMENT_OTHER)
Admission: RE | Admit: 2018-06-16 | Discharge: 2018-06-16 | Disposition: A | Discharge status: Home / Self Care | Payer: 59 | Source: Ambulatory Visit | Attending: Family Medicine | Admitting: Family Medicine

## 2018-06-16 ENCOUNTER — Other Ambulatory Visit (INDEPENDENT_AMBULATORY_CARE_PROVIDER_SITE_OTHER): Payer: 59

## 2018-06-16 ENCOUNTER — Other Ambulatory Visit: Payer: Self-pay

## 2018-06-16 DIAGNOSIS — R7989 Other specified abnormal findings of blood chemistry: Secondary | ICD-10-CM | POA: Diagnosis not present

## 2018-06-16 DIAGNOSIS — R0602 Shortness of breath: Secondary | ICD-10-CM

## 2018-06-16 DIAGNOSIS — Z0189 Encounter for other specified special examinations: Secondary | ICD-10-CM

## 2018-06-16 LAB — D-DIMER, QUANTITATIVE: D-Dimer, Quant: 0.88 mcg/mL FEU — ABNORMAL HIGH (ref <0.50)

## 2018-06-16 MED ORDER — IOHEXOL 350 MG/ML SOLN
100.0000 mL | Freq: Once | INTRAVENOUS | Status: AC | PRN
  Administered 2018-06-16: 100 mL via INTRAVENOUS

## 2018-06-16 NOTE — Progress Notes (Signed)
Despite treatment, pt continues to have SOB and chest pain and asking for D dimer.  She is aware that if positive, we will need to order a CT angiogram and remains in agreement.

## 2018-06-16 NOTE — Addendum Note (Signed)
Addended by: Lynnea Ferrier on: 06/16/2018 10:47 AM   Modules accepted: Orders

## 2018-06-16 NOTE — Progress Notes (Signed)
Ct ang

## 2018-06-17 ENCOUNTER — Other Ambulatory Visit: Payer: Self-pay | Admitting: Family Medicine

## 2018-06-17 MED ORDER — ALBUTEROL SULFATE HFA 108 (90 BASE) MCG/ACT IN AERS: 2.0000 | INHALATION_SPRAY | Freq: Four times a day (QID) | RESPIRATORY_TRACT | 0 refills | Status: DC | PRN

## 2018-06-17 MED ORDER — METHYLPHENIDATE HCL ER (OSM) 54 MG PO TBCR: 54.0000 mg | EXTENDED_RELEASE_TABLET | ORAL | 0 refills | Status: DC

## 2018-06-24 ENCOUNTER — Other Ambulatory Visit: Payer: Self-pay | Admitting: Family Medicine

## 2018-06-24 MED ORDER — ALPRAZOLAM 2 MG PO TABS: 2.0000 mg | ORAL_TABLET | Freq: Three times a day (TID) | ORAL | 0 refills | Status: DC | PRN

## 2018-06-24 MED ORDER — AMPHETAMINE-DEXTROAMPHETAMINE 30 MG PO TABS: 30.0000 mg | ORAL_TABLET | Freq: Three times a day (TID) | ORAL | 0 refills | Status: DC

## 2018-07-05 ENCOUNTER — Other Ambulatory Visit: Payer: Self-pay | Admitting: Family Medicine

## 2018-07-11 ENCOUNTER — Telehealth: Payer: Self-pay

## 2018-07-11 ENCOUNTER — Telehealth: Payer: Self-pay | Admitting: Family Medicine

## 2018-07-11 DIAGNOSIS — Z20822 Contact with and (suspected) exposure to covid-19: Secondary | ICD-10-CM

## 2018-07-11 NOTE — Telephone Encounter (Signed)
Called pt to schedule son and pt is is also requesting Covid 19 testing. Pt is asymptomatic and works for [employer]. Pt informed of address of testing site, asked pt to stay in car and to wear mask to testing site. Appt is at 3:15 07/12/18 at [facility].

## 2018-07-12 ENCOUNTER — Other Ambulatory Visit: Payer: 59

## 2018-07-12 DIAGNOSIS — Z20822 Contact with and (suspected) exposure to covid-19: Secondary | ICD-10-CM

## 2018-07-17 LAB — NOVEL CORONAVIRUS, NAA: SARS-CoV-2, NAA: NOT DETECTED

## 2018-07-20 ENCOUNTER — Other Ambulatory Visit: Payer: Self-pay | Admitting: Family Medicine

## 2018-07-20 MED ORDER — LYRICA 150 MG PO CAPS: 150.0000 mg | ORAL_CAPSULE | Freq: Two times a day (BID) | ORAL | 1 refills | Status: DC

## 2018-07-20 MED ORDER — ALPRAZOLAM 2 MG PO TABS: 2.0000 mg | ORAL_TABLET | Freq: Three times a day (TID) | ORAL | 0 refills | Status: DC | PRN

## 2018-07-20 MED ORDER — AMPHETAMINE-DEXTROAMPHETAMINE 30 MG PO TABS: 30.0000 mg | ORAL_TABLET | Freq: Three times a day (TID) | ORAL | 0 refills | Status: DC

## 2018-07-20 MED ORDER — METHYLPHENIDATE HCL ER (OSM) 54 MG PO TBCR: 54.0000 mg | EXTENDED_RELEASE_TABLET | ORAL | 0 refills | Status: DC

## 2018-07-22 ENCOUNTER — Encounter: Payer: Self-pay | Admitting: Family Medicine

## 2018-07-28 ENCOUNTER — Telehealth: Payer: Self-pay | Admitting: Family Medicine

## 2018-07-28 ENCOUNTER — Other Ambulatory Visit: Payer: Self-pay | Admitting: Family Medicine

## 2018-07-28 MED ORDER — SERTRALINE HCL 100 MG PO TABS: 100.0000 mg | ORAL_TABLET | Freq: Every day | ORAL | 3 refills | Status: DC

## 2018-07-28 MED ORDER — MOMETASONE FURO-FORMOTEROL FUM 200-5 MCG/ACT IN AERO: 2.0000 | INHALATION_SPRAY | Freq: Two times a day (BID) | RESPIRATORY_TRACT | 3 refills | Status: DC

## 2018-07-28 NOTE — Telephone Encounter (Signed)
PA started for Dylera, waiting for result.   Kayla Jacobson (Key: Lenna Gilford)

## 2018-07-29 NOTE — Telephone Encounter (Signed)
PA denied. Waiting insurance to response to appeal letter.

## 2018-07-30 ENCOUNTER — Telehealth: Payer: Self-pay | Admitting: Family Medicine

## 2018-07-30 MED ORDER — BUDESONIDE-FORMOTEROL FUMARATE 160-4.5 MCG/ACT IN AERO: 2.0000 | INHALATION_SPRAY | Freq: Two times a day (BID) | RESPIRATORY_TRACT | 3 refills | Status: DC

## 2018-07-30 NOTE — Telephone Encounter (Signed)
Spoke to pt.  dulera too expensive.  Asking for sympticort to be sent to her pharmacy as an alternative which is very reasonable.  eRx sent.

## 2018-08-16 ENCOUNTER — Other Ambulatory Visit: Payer: Self-pay | Admitting: Family Medicine

## 2018-08-16 MED ORDER — METHYLPHENIDATE HCL ER (OSM) 54 MG PO TBCR: 54.0000 mg | EXTENDED_RELEASE_TABLET | ORAL | 0 refills | Status: DC

## 2018-08-19 ENCOUNTER — Other Ambulatory Visit: Payer: Self-pay | Admitting: Family Medicine

## 2018-08-19 MED ORDER — AMPHETAMINE-DEXTROAMPHETAMINE 30 MG PO TABS: 30.0000 mg | ORAL_TABLET | Freq: Three times a day (TID) | ORAL | 0 refills | Status: DC

## 2018-08-19 MED ORDER — ALPRAZOLAM 2 MG PO TABS: 2.0000 mg | ORAL_TABLET | Freq: Three times a day (TID) | ORAL | 0 refills | Status: DC | PRN

## 2018-09-10 ENCOUNTER — Other Ambulatory Visit: Payer: Self-pay | Admitting: Family Medicine

## 2018-09-10 MED ORDER — METHYLPHENIDATE HCL ER (OSM) 54 MG PO TBCR: 54.0000 mg | EXTENDED_RELEASE_TABLET | ORAL | 0 refills | Status: DC

## 2018-09-10 MED ORDER — ALPRAZOLAM 2 MG PO TABS: 2.0000 mg | ORAL_TABLET | Freq: Three times a day (TID) | ORAL | 0 refills | Status: DC | PRN

## 2018-09-10 MED ORDER — BUTALBITAL-APAP-CAFFEINE 50-325-40 MG PO TABS: 1.0000 | ORAL_TABLET | Freq: Four times a day (QID) | ORAL | 0 refills | Status: DC | PRN

## 2018-09-10 MED ORDER — AMPHETAMINE-DEXTROAMPHETAMINE 30 MG PO TABS: 30.0000 mg | ORAL_TABLET | Freq: Three times a day (TID) | ORAL | 0 refills | Status: DC

## 2018-09-17 ENCOUNTER — Other Ambulatory Visit: Payer: Self-pay | Admitting: Family Medicine

## 2018-09-17 DIAGNOSIS — F4310 Post-traumatic stress disorder, unspecified: Secondary | ICD-10-CM

## 2018-09-17 DIAGNOSIS — F3170 Bipolar disorder, currently in remission, most recent episode unspecified: Secondary | ICD-10-CM

## 2018-09-17 DIAGNOSIS — F988 Other specified behavioral and emotional disorders with onset usually occurring in childhood and adolescence: Secondary | ICD-10-CM

## 2018-09-17 DIAGNOSIS — F339 Major depressive disorder, recurrent, unspecified: Secondary | ICD-10-CM

## 2018-09-17 NOTE — Progress Notes (Signed)
Patient has been having a difficult time, feeling overwhelmed.  She agrees to psychiatry referral.

## 2018-09-20 ENCOUNTER — Encounter (HOSPITAL_COMMUNITY): Payer: Self-pay

## 2018-09-20 ENCOUNTER — Emergency Department (HOSPITAL_COMMUNITY): Payer: 59

## 2018-09-20 ENCOUNTER — Emergency Department (HOSPITAL_COMMUNITY)
Admission: EM | Admit: 2018-09-20 | Discharge: 2018-09-20 | Disposition: A | Discharge status: Home / Self Care | Payer: 59 | Attending: Emergency Medicine | Admitting: Emergency Medicine

## 2018-09-20 ENCOUNTER — Other Ambulatory Visit: Payer: Self-pay

## 2018-09-20 DIAGNOSIS — Z79899 Other long term (current) drug therapy: Secondary | ICD-10-CM | POA: Diagnosis not present

## 2018-09-20 DIAGNOSIS — Z87891 Personal history of nicotine dependence: Secondary | ICD-10-CM | POA: Insufficient documentation

## 2018-09-20 DIAGNOSIS — R109 Unspecified abdominal pain: Secondary | ICD-10-CM

## 2018-09-20 DIAGNOSIS — R1011 Right upper quadrant pain: Secondary | ICD-10-CM | POA: Diagnosis present

## 2018-09-20 LAB — COMPREHENSIVE METABOLIC PANEL
ALT: 27 U/L (ref 0–44)
AST: 30 U/L (ref 15–41)
Albumin: 4.1 g/dL (ref 3.5–5.0)
Alkaline Phosphatase: 75 U/L (ref 38–126)
Anion gap: 9 (ref 5–15)
BUN: 8 mg/dL (ref 6–20)
CO2: 24 mmol/L (ref 22–32)
Calcium: 8.9 mg/dL (ref 8.9–10.3)
Chloride: 109 mmol/L (ref 98–111)
Creatinine, Ser: 0.62 mg/dL (ref 0.44–1.00)
GFR calc Af Amer: >60 mL/min (ref >60)
GFR calc non Af Amer: >60 mL/min (ref >60)
Glucose, Bld: 90 mg/dL (ref 70–99)
Potassium: 4.3 mmol/L (ref 3.5–5.1)
Sodium: 142 mmol/L (ref 135–145)
Total Bilirubin: 0.6 mg/dL (ref 0.3–1.2)
Total Protein: 7.6 g/dL (ref 6.5–8.1)

## 2018-09-20 LAB — CBC
HCT: 43.6 % (ref 36.0–46.0)
Hemoglobin: 14.3 g/dL (ref 12.0–15.0)
MCH: 32.6 pg (ref 26.0–34.0)
MCHC: 32.8 g/dL (ref 30.0–36.0)
MCV: 99.5 fL (ref 80.0–100.0)
Platelets: 269 10*3/uL (ref 150–400)
RBC: 4.38 MIL/uL (ref 3.87–5.11)
RDW: 13.2 % (ref 11.5–15.5)
WBC: 5.8 10*3/uL (ref 4.0–10.5)
nRBC: 0 % (ref 0.0–0.2)

## 2018-09-20 LAB — LIPASE, BLOOD: Lipase: 24 U/L (ref 11–51)

## 2018-09-20 LAB — URINALYSIS, ROUTINE W REFLEX MICROSCOPIC
Bacteria, UA: NONE SEEN
Bilirubin Urine: NEGATIVE
Glucose, UA: NEGATIVE mg/dL
Ketones, ur: NEGATIVE mg/dL
Leukocytes,Ua: NEGATIVE
Nitrite: NEGATIVE
Protein, ur: NEGATIVE mg/dL
Specific Gravity, Urine: 1.018 (ref 1.005–1.030)
pH: 5 (ref 5.0–8.0)

## 2018-09-20 MED ORDER — IOHEXOL 300 MG/ML  SOLN
100.0000 mL | Freq: Once | INTRAMUSCULAR | Status: AC | PRN
  Administered 2018-09-20: 17:00:00 100 mL via INTRAVENOUS

## 2018-09-20 MED ORDER — ONDANSETRON HCL 4 MG/2ML IJ SOLN
4.0000 mg | Freq: Once | INTRAMUSCULAR | Status: AC
  Administered 2018-09-20: 4 mg via INTRAVENOUS
  Filled 2018-09-20: qty 2

## 2018-09-20 MED ORDER — FENTANYL CITRATE (PF) 100 MCG/2ML IJ SOLN
50.0000 ug | Freq: Once | INTRAMUSCULAR | Status: AC
  Administered 2018-09-20: 50 ug via INTRAVENOUS
  Filled 2018-09-20: qty 2

## 2018-09-20 MED ORDER — ETODOLAC 300 MG PO CAPS: 300.0000 mg | ORAL_CAPSULE | Freq: Three times a day (TID) | ORAL | 0 refills | Status: DC

## 2018-09-20 MED ORDER — LORAZEPAM 1 MG PO TABS
1.0000 mg | ORAL_TABLET | Freq: Once | ORAL | Status: AC
  Administered 2018-09-20: 1 mg via ORAL
  Filled 2018-09-20: qty 1

## 2018-09-20 MED ORDER — SODIUM CHLORIDE (PF) 0.9 % IJ SOLN
INTRAMUSCULAR | Status: AC
  Filled 2018-09-20: qty 50

## 2018-09-20 MED ORDER — SODIUM CHLORIDE 0.9% FLUSH: 3.0000 mL | Freq: Once | INTRAVENOUS | Status: DC

## 2018-09-20 NOTE — ED Notes (Signed)
Pt verbalized understanding of D/C instructions and belongings are returned. Pt is ambulatory  Out of ED.

## 2018-09-20 NOTE — ED Provider Notes (Addendum)
[facility] DEPT Provider Note   CSN: [id number] Arrival date & time: 09/20/18  1212     History   Chief Complaint Chief Complaint  Patient presents with  . Abdominal Pain    HPI Kayla Jacobson is a 43 y.o. female.  Presents emerged from chief complaint abdominal pain.  Patient reports pain has been going on primarily over the last few days, starts on her right upper quadrant, wraps around to her right flank and right back.  No nausea, vomiting or diarrhea.  Last bowel movement was yesterday had a small normal appearing stool.  No blood.  States that she has a history of cholecystectomy.  Has noted some mild sore throat as well as some mild intermittent headaches.  Patient reports has prior history of headaches and migraines, not worst headache of her life, not sudden onset.  No difficulty swallowing, no neck swelling, no neck stiffness.    HPI  Past Medical History:  Diagnosis Date  . ADD (attention deficit disorder)   . Anxiety   . Arthritis   . Bipolar disorder ([place])   . Chiari malformation   . Depression    bipolar  . Dysrhythmia   . Fibromyalgia   . Headache    migraines  . Seasonal asthma     Patient Active Problem List   Diagnosis Date Noted  . Depression, recurrent ([place]) 05/19/2018  . Severe obesity (BMI >= 40) ([place]) 05/19/2018  . ADD (attention deficit disorder) 05/13/2017  . PTSD (post-traumatic stress disorder) 05/13/2017  . Anxiety disorder 05/13/2017  . Chiari malformation type I ([place]) 05/13/2017  . PCOS (polycystic ovarian syndrome) 05/13/2017    Past Surgical History:  Procedure Laterality Date  . ABLATION    . CARPAL TUNNEL RELEASE     bilateral  . CHOLECYSTECTOMY    . HYSTEROSCOPY W/ ENDOMETRIAL ABLATION    . LUMBAR LAMINECTOMY/DECOMPRESSION MICRODISCECTOMY Left 11/21/2015   Procedure: CENTRAL LUMBAR DECOMPRESSION MICRODISCECTOMY L4-L5, MICRODISECTOMY L4-L5 LEFT; FORAMINOTOMY L4 AND L5 ROOT LEFT;  Surgeon:  Latanya Maudlin, MD;  Location: [facility];  Service: Orthopedics;  Laterality: Left;  . TUBAL LIGATION    . WISDOM TOOTH EXTRACTION       OB History   No obstetric history on file.      Home Medications    Prior to Admission medications   Medication Sig Start Date End Date Taking? Authorizing Provider  alprazolam Duanne Moron) 2 MG tablet Take 1 tablet (2 mg total) by mouth 3 (three) times daily as needed for anxiety or sleep. 09/10/18   Lucille Passy, MD  amphetamine-dextroamphetamine (ADDERALL) 30 MG tablet Take 1 tablet by mouth 3 (three) times daily. 09/10/18   Lucille Passy, MD  ASHWAGANDHA PO Take 800 mg by mouth daily.    [provider]  Biotin 5000 MCG TABS Take 1 tablet daily by mouth.    [provider]  budesonide-formoterol (SYMBICORT) 160-4.5 MCG/ACT inhaler Inhale 2 puffs into the lungs 2 (two) times daily. 07/30/18   Lucille Passy, MD  butalbital-acetaminophen-caffeine (FIORICET) [phone] MG tablet Take 1-2 tablets by mouth every 6 (six) hours as needed for headache. 09/10/18 09/10/19  Lucille Passy, MD  carisoprodol (SOMA) 350 MG tablet Take 1 tablet (350 mg total) by mouth 4 (four) times daily as needed for muscle spasms. 04/12/18   Lucille Passy, MD  Cholecalciferol 5000 units TABS Take 1 tablet daily by mouth.    [provider]  GLUCOSAMINE-CHONDROITIN PO Take 2 tablets  daily by mouth.    [provider]  levalbuterol Penne Lash) 0.63 MG/3ML nebulizer solution Take 3 mLs (0.63 mg total) by nebulization once for 1 dose. 03/21/18 03/21/18  Lucille Passy, MD  LYRICA 150 MG capsule Take 1 capsule (150 mg total) by mouth 2 (two) times daily. 07/20/18   Lucille Passy, MD  metFORMIN (GLUCOPHAGE) 500 MG tablet Take 1 tablet (500 mg total) by mouth daily with breakfast. 12/18/17   Lucille Passy, MD  methylphenidate (CONCERTA) 54 MG PO CR tablet Take 1 tablet (54 mg total) by mouth every morning. 09/10/18   Lucille Passy, MD  Oxycodone HCl 10 MG TABS Take 1 tablet (10  mg total) by mouth every 8 (eight) hours as needed. 06/11/18   Lucille Passy, MD  prazosin (MINIPRESS) 1 MG capsule TAKE 1 CAPSULE BY MOUTH TWICE A DAY 04/21/18   Lucille Passy, MD  sertraline (ZOLOFT) 100 MG tablet Take 1 tablet (100 mg total) by mouth daily. 07/28/18   Lucille Passy, MD  tamsulosin (FLOMAX) 0.4 MG CAPS capsule Take 1 capsule (0.4 mg total) by mouth daily. 06/10/18   Lucille Passy, MD  TURMERIC PO Take 1 tablet daily by mouth.    [provider]  VENTOLIN HFA 108 (90 Base) MCG/ACT inhaler TAKE 2 PUFFS BY MOUTH EVERY 6 HOURS AS NEEDED 07/07/18   Lucille Passy, MD    Family History Family History  Problem Relation Age of Onset  . Uterine cancer Mother   . Pulmonary embolism Mother   . Anxiety disorder Mother   . Depression Mother   . Hyperlipidemia Mother   . Breast cancer Other        grandmother  . Diabetes Father   . Bipolar disorder Sister   . Hypersomnolence Sister   . Chiari malformation Sister   . Chiari malformation Brother   . Lumbar disc disease Brother   . Polymyalgia rheumatica Maternal Aunt   . ADD / ADHD Son   . Breast cancer Maternal Grandmother   . Clotting disorder Maternal Grandfather   . Lupus Paternal Grandmother   . Heart attack Paternal Grandfather     Social History Social History   Tobacco Use  . Smoking status: Former Smoker    Types: E-cigarettes  . Smokeless tobacco: Never Used  Substance Use Topics  . Alcohol use: Yes    Comment: occasional-states 2 month  . Drug use: Never     Allergies   Cymbalta [duloxetine hcl], Other, Advair diskus [fluticasone-salmeterol], Gabapentin, and Trazodone and nefazodone   Review of Systems Review of Systems  Constitutional: Negative for chills and fever.  HENT: Positive for sore throat. Negative for ear pain.   Eyes: Negative for pain and visual disturbance.  Respiratory: Negative for cough and shortness of breath.   Cardiovascular: Negative for chest pain and palpitations.   Gastrointestinal: Positive for abdominal pain. Negative for vomiting.  Genitourinary: Negative for dysuria and hematuria.  Musculoskeletal: Negative for arthralgias and back pain.  Skin: Negative for color change and rash.  Neurological: Negative for seizures and syncope.  All other systems reviewed and are negative.    Physical Exam Updated Vital Signs BP (!) 172/104   Pulse (!) 50   Temp 98.2 F (36.8 C) (Oral)   Resp 18   Ht 5\' 5"  (1.651 m)   SpO2 96%   BMI 48.33 kg/m   Physical Exam Vitals signs and nursing note reviewed.  Constitutional:  General: She is not in acute distress.    Appearance: She is well-developed.  HENT:     Head: Normocephalic and atraumatic.  Eyes:     Conjunctiva/sclera: Conjunctivae normal.  Neck:     Musculoskeletal: Neck supple.  Cardiovascular:     Rate and Rhythm: Normal rate and regular rhythm.     Heart sounds: No murmur.  Pulmonary:     Effort: Pulmonary effort is normal. No respiratory distress.     Breath sounds: Normal breath sounds.  Abdominal:     Palpations: Abdomen is soft.     Tenderness: There is abdominal tenderness in the right upper quadrant.     Comments: There is palpation right flank, right upper quadrant, otherwise soft nontender abdomen  Skin:    General: Skin is warm and dry.  Neurological:     Mental Status: She is alert.      ED Treatments / Results  Labs (all labs ordered are listed, but only abnormal results are displayed) Labs Reviewed  URINALYSIS, ROUTINE W REFLEX MICROSCOPIC - Abnormal; Notable for the following components:      Result Value   APPearance HAZY (*)    Hgb urine dipstick SMALL (*)    All other components within normal limits  LIPASE, BLOOD  COMPREHENSIVE METABOLIC PANEL  CBC    EKG None  Radiology No results found.  Procedures Procedures (including critical care time)  Medications Ordered in ED Medications  sodium chloride flush (NS) 0.9 % injection 3 mL (has no  administration in time range)  sodium chloride (PF) 0.9 % injection (has no administration in time range)  fentaNYL (SUBLIMAZE) injection 50 mcg (50 mcg Intravenous Given 09/20/18 1434)  ondansetron (ZOFRAN) injection 4 mg (4 mg Intravenous Given 09/20/18 1434)  LORazepam (ATIVAN) tablet 1 mg (1 mg Oral Given 09/20/18 1606)  iohexol (OMNIPAQUE) 300 MG/ML solution 100 mL (100 mLs Intravenous Contrast Given 09/20/18 1712)     Initial Impression / Assessment and Plan / ED Course  I have reviewed the triage vital signs and the nursing notes.  Pertinent labs & imaging results that were available during my care of the patient were reviewed by me and considered in my medical decision making (see chart for details).        43 year old with right-sided abdominal pain, right flank pain.  Here well-appearing with normal vital signs, soft abdomen but did notice some tenderness to palpation in right flank.  Labs within normal limits, urine with small blood.  CT scan ordered and pending at time of signout.  Please refer to Dr. Johnsie Kindred note for final plan disposition pending CT scan and patient reassessment.  Final Clinical Impressions(s) / ED Diagnoses   Final diagnoses:  Flank pain  Right sided abdominal pain    ED Discharge Orders    None       Lucrezia Starch, MD 09/20/18 1728    Lucrezia Starch, MD 09/20/18 1729

## 2018-09-20 NOTE — ED Notes (Signed)
Pt is increasingly anxious r/t not having her husband present.

## 2018-09-20 NOTE — Discharge Instructions (Addendum)
Try taking the lodine to see if it helps with your pain.  Follow up with your doctor to discuss further evaluation

## 2018-09-20 NOTE — ED Triage Notes (Signed)
Patient reports that she has had abdominal pain for months,but has become severe in the past 3 days. Patient states the pain radiates into the mid back. Patient denies N/V/D.  Patient also c/o right sided throat pain that started 3 days as well.

## 2018-09-20 NOTE — ED Notes (Signed)
Patient transported to CT 

## 2018-09-20 NOTE — ED Provider Notes (Signed)
Labs and CT scan findings discussed with patient.  Renal stone noted but no signs of ureteral obstruction or hydronephrosis to suggest the stone is the etiology of her symptoms.  Pt frustrated by the lack of diagnosis.  Certainly understandable.  Pt states this has been ongoing off and on for months.  Encouraged outpatient follow up consider the persistent nature.  At this time there does not appear to be any evidence of an acute emergency medical condition and the patient appears stable for discharge with appropriate outpatient follow up.    Dorie Rank, MD 09/20/18 [phone]

## 2018-09-20 NOTE — ED Notes (Signed)
Pt made aware urine sample needed 

## 2018-09-29 ENCOUNTER — Other Ambulatory Visit: Payer: Self-pay

## 2018-09-29 ENCOUNTER — Other Ambulatory Visit (INDEPENDENT_AMBULATORY_CARE_PROVIDER_SITE_OTHER): Payer: 59

## 2018-09-29 ENCOUNTER — Ambulatory Visit (INDEPENDENT_AMBULATORY_CARE_PROVIDER_SITE_OTHER): Payer: 59

## 2018-09-29 ENCOUNTER — Telehealth: Payer: Self-pay | Admitting: Family Medicine

## 2018-09-29 DIAGNOSIS — G8929 Other chronic pain: Secondary | ICD-10-CM

## 2018-09-29 DIAGNOSIS — M546 Pain in thoracic spine: Secondary | ICD-10-CM | POA: Diagnosis not present

## 2018-09-29 DIAGNOSIS — M542 Cervicalgia: Secondary | ICD-10-CM

## 2018-09-29 NOTE — Telephone Encounter (Signed)
   Pt having severe back pain- C spine and T spine with positive Carnett signs. Cervical and thoracic xrays ordered. Will also order antiphospholipid panel. The patient indicates understanding of these issues and agrees with the plan.

## 2018-09-30 ENCOUNTER — Other Ambulatory Visit: Payer: Self-pay | Admitting: Family Medicine

## 2018-09-30 ENCOUNTER — Encounter: Payer: Self-pay | Admitting: Family Medicine

## 2018-09-30 DIAGNOSIS — Z9889 Other specified postprocedural states: Secondary | ICD-10-CM

## 2018-09-30 DIAGNOSIS — M549 Dorsalgia, unspecified: Secondary | ICD-10-CM

## 2018-09-30 DIAGNOSIS — M4134 Thoracogenic scoliosis, thoracic region: Secondary | ICD-10-CM

## 2018-10-01 MED ORDER — CARISOPRODOL 350 MG PO TABS: 350.0000 mg | ORAL_TABLET | Freq: Four times a day (QID) | ORAL | 0 refills | Status: DC | PRN

## 2018-10-03 LAB — ANTIPHOSPHOLIPID SYNDROME DIAGNOSTIC PANEL
Anticardiolipin IgA: <11 [APL'U] (ref <=11)
Anticardiolipin IgG: <14 [GPL'U] (ref <=14)
Anticardiolipin IgM: <12 [MPL'U] (ref <=12)
Beta-2 Glyco 1 IgA: <9 SAU (ref <=20)
Beta-2 Glyco 1 IgM: <9 SMU (ref <=20)
Beta-2 Glyco I IgG: <9 SGU (ref <=20)
PTT-LA Screen: 35 s (ref <=40)
dRVVT: 37 s (ref <=45)

## 2018-10-12 ENCOUNTER — Other Ambulatory Visit: Payer: Self-pay | Admitting: Family Medicine

## 2018-10-12 ENCOUNTER — Encounter: Payer: Self-pay | Admitting: Family Medicine

## 2018-10-12 MED ORDER — ALPRAZOLAM 2 MG PO TABS: 2.0000 mg | ORAL_TABLET | Freq: Three times a day (TID) | ORAL | 5 refills | Status: DC | PRN

## 2018-10-12 MED ORDER — AMPHETAMINE-DEXTROAMPHETAMINE 30 MG PO TABS: 30.0000 mg | ORAL_TABLET | Freq: Three times a day (TID) | ORAL | 0 refills | Status: DC

## 2018-10-12 MED ORDER — CARISOPRODOL 350 MG PO TABS: 350.0000 mg | ORAL_TABLET | Freq: Four times a day (QID) | ORAL | 5 refills | Status: DC | PRN

## 2018-10-12 MED ORDER — SERTRALINE HCL 100 MG PO TABS: 150.0000 mg | ORAL_TABLET | Freq: Every day | ORAL | 3 refills | Status: DC

## 2018-11-08 ENCOUNTER — Other Ambulatory Visit: Payer: Self-pay | Admitting: Family Medicine

## 2018-11-08 MED ORDER — CLARITHROMYCIN 500 MG PO TABS: 500.0000 mg | ORAL_TABLET | Freq: Two times a day (BID) | ORAL | 0 refills | Status: DC

## 2018-11-15 ENCOUNTER — Other Ambulatory Visit: Payer: Self-pay | Admitting: Family Medicine

## 2018-11-15 ENCOUNTER — Other Ambulatory Visit: Payer: Self-pay | Admitting: Neurosurgery

## 2018-11-15 DIAGNOSIS — M546 Pain in thoracic spine: Secondary | ICD-10-CM

## 2018-11-15 DIAGNOSIS — Z8669 Personal history of other diseases of the nervous system and sense organs: Secondary | ICD-10-CM

## 2018-11-15 MED ORDER — HYDROXYZINE PAMOATE 50 MG PO CAPS: 50.0000 mg | ORAL_CAPSULE | Freq: Every day | ORAL | 0 refills | Status: DC

## 2018-11-15 MED ORDER — LURASIDONE HCL 40 MG PO TABS: 40.0000 mg | ORAL_TABLET | Freq: Every day | ORAL | Status: DC

## 2018-11-16 ENCOUNTER — Other Ambulatory Visit (HOSPITAL_COMMUNITY): Payer: Self-pay | Admitting: Neurosurgery

## 2018-11-16 DIAGNOSIS — M546 Pain in thoracic spine: Secondary | ICD-10-CM

## 2018-11-16 DIAGNOSIS — Z8669 Personal history of other diseases of the nervous system and sense organs: Secondary | ICD-10-CM

## 2018-11-18 ENCOUNTER — Other Ambulatory Visit: Payer: Self-pay | Admitting: Family Medicine

## 2018-11-18 MED ORDER — HYDROXYZINE PAMOATE 50 MG PO CAPS: 50.0000 mg | ORAL_CAPSULE | Freq: Every day | ORAL | 5 refills | Status: DC

## 2018-11-23 ENCOUNTER — Ambulatory Visit (HOSPITAL_COMMUNITY)
Admission: RE | Admit: 2018-11-23 | Discharge: 2018-11-23 | Disposition: A | Discharge status: Home / Self Care | Payer: 59 | Source: Ambulatory Visit | Attending: Neurosurgery | Admitting: Neurosurgery

## 2018-11-23 ENCOUNTER — Encounter (HOSPITAL_COMMUNITY): Payer: Self-pay

## 2018-11-23 ENCOUNTER — Other Ambulatory Visit: Payer: Self-pay | Admitting: Family Medicine

## 2018-11-23 ENCOUNTER — Other Ambulatory Visit: Payer: Self-pay

## 2018-11-23 DIAGNOSIS — Z8669 Personal history of other diseases of the nervous system and sense organs: Secondary | ICD-10-CM

## 2018-11-23 DIAGNOSIS — M546 Pain in thoracic spine: Secondary | ICD-10-CM | POA: Diagnosis present

## 2018-11-23 MED ORDER — AZITHROMYCIN 250 MG PO TABS: ORAL_TABLET | ORAL | 0 refills | Status: DC

## 2018-12-13 ENCOUNTER — Other Ambulatory Visit: Payer: Self-pay | Admitting: Family Medicine

## 2018-12-13 MED ORDER — LURASIDONE HCL 80 MG PO TABS: 80.0000 mg | ORAL_TABLET | Freq: Every day | ORAL | 6 refills | Status: DC

## 2018-12-14 ENCOUNTER — Other Ambulatory Visit: Payer: Self-pay | Admitting: Family Medicine

## 2018-12-14 DIAGNOSIS — F411 Generalized anxiety disorder: Secondary | ICD-10-CM

## 2018-12-16 ENCOUNTER — Telehealth: Payer: Self-pay | Admitting: Family Medicine

## 2018-12-16 NOTE — Telephone Encounter (Signed)
amphetamine-dextroamphetamine (ADDERALL) 30 MG tablet  amphetamine-dextroamphetamine (ADDERALL) 30 MG tablet  amphetamine-dextroamphetamine (ADDERALL) 30 MG tablet  [facility] called and is concern due to pt getting a Adderall script from a Dr Lillia Mountain  On 11-12 of 45 tablets of 1 1/2 a day.   He also has an additional script from Dr Deborra Medina on 10/13 of 1 3 times a day at 45 tablets  The different dr, different dose alerted him.Marland Kitchenalso the script from [place] in between refills. please advise before they proceed with the refill sent. He is helping them out speak to any pharmist  [id number] [place], [place] - [address] Phone:  [phone]  Fax:  [phone]

## 2018-12-17 ENCOUNTER — Other Ambulatory Visit: Payer: Self-pay

## 2018-12-17 NOTE — Telephone Encounter (Signed)
Spoke with pharmacist they inactivated old pcp orders and refill meds from current pcp.

## 2018-12-17 NOTE — Telephone Encounter (Signed)
Okay to refill as she is no longer seeing Dr. Edsel Petrin.  Please thank them for calling me about concerns like this!!

## 2019-01-03 ENCOUNTER — Other Ambulatory Visit: Payer: Self-pay | Admitting: Family Medicine

## 2019-01-03 MED ORDER — PROMETHAZINE-DM 6.25-15 MG/5ML PO SYRP: 5.0000 mL | ORAL_SOLUTION | Freq: Four times a day (QID) | ORAL | 0 refills | Status: DC | PRN

## 2019-01-05 ENCOUNTER — Other Ambulatory Visit: Payer: Self-pay | Admitting: Family Medicine

## 2019-01-05 DIAGNOSIS — R05 Cough: Secondary | ICD-10-CM

## 2019-01-05 DIAGNOSIS — U071 COVID-19: Secondary | ICD-10-CM

## 2019-01-05 DIAGNOSIS — R053 Chronic cough: Secondary | ICD-10-CM

## 2019-01-06 ENCOUNTER — Other Ambulatory Visit: Payer: Self-pay

## 2019-01-06 ENCOUNTER — Encounter: Payer: Self-pay | Admitting: Family Medicine

## 2019-01-06 ENCOUNTER — Ambulatory Visit (INDEPENDENT_AMBULATORY_CARE_PROVIDER_SITE_OTHER): Payer: 59

## 2019-01-06 ENCOUNTER — Telehealth: Payer: Self-pay | Admitting: Family Medicine

## 2019-01-06 ENCOUNTER — Other Ambulatory Visit: Payer: Self-pay | Admitting: Family Medicine

## 2019-01-06 DIAGNOSIS — R05 Cough: Secondary | ICD-10-CM

## 2019-01-06 DIAGNOSIS — U071 COVID-19: Secondary | ICD-10-CM

## 2019-01-06 DIAGNOSIS — J189 Pneumonia, unspecified organism: Secondary | ICD-10-CM

## 2019-01-06 DIAGNOSIS — R053 Chronic cough: Secondary | ICD-10-CM

## 2019-01-06 DIAGNOSIS — F411 Generalized anxiety disorder: Secondary | ICD-10-CM

## 2019-01-06 MED ORDER — LEVOFLOXACIN 500 MG PO TABS: 500.0000 mg | ORAL_TABLET | Freq: Every day | ORAL | 0 refills | Status: AC

## 2019-01-06 NOTE — Telephone Encounter (Signed)
Called and LM for pt to return call to office to discuss xray result. CXR shows LLL pneumonia. Pt diagnosed with covid 11 days ago. Pt will need abx and repeat CXR in 4 wks. Abx sent to pharm. Pt aware. Advised f/u with PCP in 1 week and pt is agreeable

## 2019-01-10 ENCOUNTER — Encounter: Payer: Self-pay | Admitting: Family Medicine

## 2019-01-10 DIAGNOSIS — U071 COVID-19: Secondary | ICD-10-CM | POA: Insufficient documentation

## 2019-01-10 DIAGNOSIS — Z8616 Personal history of COVID-19: Secondary | ICD-10-CM | POA: Insufficient documentation

## 2019-01-10 NOTE — Progress Notes (Signed)
Virtual Visit via Video   Due to the COVID-19 pandemic, this visit was completed with telemedicine (audio/video) technology to reduce patient and provider exposure as well as to preserve personal protective equipment.   I connected with Kayla Jacobson by a video enabled telemedicine application and verified that I am speaking with the correct person using two identifiers. Location patient: Home Location provider: [facility], Office Persons participating in the virtual visit: Kayla Jacobson, Arnette Norris, MD   I discussed the limitations of evaluation and management by telemedicine and the availability of in person appointments. The patient expressed understanding and agreed to proceed.  Care Team   Patient Care Team: Lucille Passy, MD as PCP - General (Family Medicine)  Subjective:   HPI:   Patient diagnosed with COVID 19 last week.  Had CXR done in our office on 12/31 for persistent cough which showed left lower lobe PNA. Dr. Bryan Lemma called in levaquin 500 mg- 1 tab by mouth daily x 10 days.  Still coughing up sputum, feels feverish, SOB.  Still very fatigued.  Cannot do anything for long without having to lie down.  Still has loss of sense of smell She feels she cannot work and needs help with her FMLA.   Review of Systems  Constitutional: Positive for malaise/fatigue. Negative for fever.  HENT: Negative for congestion and hearing loss.   Eyes: Negative for blurred vision, discharge and redness.  Respiratory: Positive for cough, sputum production and shortness of breath.   Cardiovascular: Negative for chest pain, palpitations and leg swelling.  Gastrointestinal: Negative for abdominal pain and heartburn.  Genitourinary: Negative for dysuria.  Musculoskeletal: Negative for falls.  Skin: Negative for rash.  Neurological: Negative for loss of consciousness and headaches.  Endo/Heme/Allergies: Does not bruise/bleed easily.  Psychiatric/Behavioral: Negative for  depression.     Patient Active Problem List   Diagnosis Date Noted  . History of 2019 novel coronavirus disease (COVID-19) 01/10/2019  . Pneumonia due to COVID-19 virus 01/10/2019  . Depression, recurrent ([place]) 05/19/2018  . Severe obesity (BMI >= 40) ([place]) 05/19/2018  . ADD (attention deficit disorder) 05/13/2017  . PTSD (post-traumatic stress disorder) 05/13/2017  . Anxiety disorder 05/13/2017  . Chiari malformation type I ([place]) 05/13/2017  . PCOS (polycystic ovarian syndrome) 05/13/2017    Social History   Tobacco Use  . Smoking status: Former Smoker    Types: E-cigarettes  . Smokeless tobacco: Never Used  Substance Use Topics  . Alcohol use: Yes    Comment: occasional-states 2 month    Current Outpatient Medications:  .  alprazolam (XANAX) 2 MG tablet, Take 1 tablet (2 mg total) by mouth 3 (three) times daily as needed for anxiety or sleep., Disp: 90 tablet, Rfl: 5 .  amphetamine-dextroamphetamine (ADDERALL) 30 MG tablet, Take 1 tablet by mouth 3 (three) times daily., Disp: 90 tablet, Rfl: 0 .  amphetamine-dextroamphetamine (ADDERALL) 30 MG tablet, Take 1 tablet by mouth 3 (three) times daily., Disp: 90 tablet, Rfl: 0 .  amphetamine-dextroamphetamine (ADDERALL) 30 MG tablet, Take 1 tablet by mouth 3 (three) times daily., Disp: 90 tablet, Rfl: 0 .  ASHWAGANDHA PO, Take 800 mg by mouth daily., Disp: , Rfl:  .  azithromycin (ZITHROMAX) 250 MG tablet, UAD, Disp: 6 tablet, Rfl: 0 .  Biotin 5000 MCG TABS, Take 1 tablet daily by mouth., Disp: , Rfl:  .  budesonide-formoterol (SYMBICORT) 160-4.5 MCG/ACT inhaler, Inhale 2 puffs into the lungs 2 (two) times daily., Disp: 1 Inhaler, Rfl: 3 .  carisoprodol (SOMA) 350 MG tablet, Take 1 tablet (350 mg total) by mouth 4 (four) times daily as needed for muscle spasms., Disp: 120 tablet, Rfl: 5 .  Cholecalciferol 5000 units TABS, Take 1 tablet daily by mouth., Disp: , Rfl:  .  clarithromycin (BIAXIN) 500 MG tablet, Take 1 tablet (500 mg  total) by mouth 2 (two) times daily., Disp: 20 tablet, Rfl: 0 .  GLUCOSAMINE-CHONDROITIN PO, Take 2 tablets daily by mouth., Disp: , Rfl:  .  hydrOXYzine (ATARAX/VISTARIL) 25 MG tablet, TAKE 1 TO 2 TABLETS BY MOUTH EVERY DAY AT BEDTIME, Disp: 180 tablet, Rfl: 3 .  hydrOXYzine (VISTARIL) 50 MG capsule, Take 1 capsule (50 mg total) by mouth at bedtime., Disp: 90 capsule, Rfl: 5 .  levalbuterol (XOPENEX) 0.63 MG/3ML nebulizer solution, Take 3 mLs (0.63 mg total) by nebulization once for 1 dose., Disp: 3 mL, Rfl: 12 .  levofloxacin (LEVAQUIN) 500 MG tablet, Take 1 tablet (500 mg total) by mouth daily for 10 days., Disp: 10 tablet, Rfl: 0 .  lurasidone (LATUDA) 80 MG TABS tablet, Take 1 tablet (80 mg total) by mouth daily with breakfast., Disp: 30 tablet, Rfl: 6 .  prazosin (MINIPRESS) 1 MG capsule, TAKE 1 CAPSULE BY MOUTH TWICE A DAY, Disp: 45 capsule, Rfl: 1 .  promethazine-dextromethorphan (PROMETHAZINE-DM) 6.25-15 MG/5ML syrup, Take 5 mLs by mouth 4 (four) times daily as needed., Disp: 118 mL, Rfl: 0 .  sertraline (ZOLOFT) 100 MG tablet, Take 1.5 tablets (150 mg total) by mouth daily., Disp: 45 tablet, Rfl: 3 .  TURMERIC PO, Take 1 tablet daily by mouth., Disp: , Rfl:  .  VENTOLIN HFA 108 (90 Base) MCG/ACT inhaler, TAKE 2 PUFFS BY MOUTH EVERY 6 HOURS AS NEEDED, Disp: 18 g, Rfl: 2  Allergies  Allergen Reactions  . Cymbalta [Duloxetine Hcl]     SI  . Influenza Vaccines Anaphylaxis and Swelling  . Other Anaphylaxis    WALNUTS---THROAT CLOSES UP  . Advair Diskus [Fluticasone-Salmeterol] Hives  . Gabapentin Other (See Comments)    it affected her motivation.   . Trazodone And Nefazodone     "made me nuts," "I wanted to pull my hair out."    Objective:  There were no vitals taken for this visit.  VITALS: Per patient if applicable, see vitals. GENERAL: Alert, appears well and in no acute distress. HEENT: Atraumatic, conjunctiva clear, no obvious abnormalities on inspection of external nose  and ears. NECK: Normal movements of the head and neck. CARDIOPULMONARY: No increased WOB. Speaking in clear sentences. I:E ratio WNL.  MS: Moves all visible extremities without noticeable abnormality. PSYCH: Pleasant and cooperative, well-groomed. Speech normal rate and rhythm. Affect is appropriate. Insight and judgement are appropriate. Attention is focused, linear, and appropriate.  NEURO: CN grossly intact. Oriented as arrived to appointment on time with no prompting. Moves both UE equally.  SKIN: No obvious lesions, wounds, erythema, or cyanosis noted on face or hands.  Depression screen PHQ 2/9 05/19/2018  Decreased Interest 3  Down, Depressed, Hopeless 3  PHQ - 2 Score 6  Altered sleeping 2  Tired, decreased energy 2  Change in appetite 2  Feeling bad or failure about yourself  2  Trouble concentrating 1  Moving slowly or fidgety/restless 0  Suicidal thoughts 0  PHQ-9 Score 15  Difficult doing work/chores Very difficult     . COVID-19 Education: The signs and symptoms of COVID-19 were discussed with the patient and how to seek care for testing if needed. The importance  of social distancing was discussed today. . Reviewed expectations re: course of current medical issues. . Discussed self-management of symptoms. . Outlined signs and symptoms indicating need for more acute intervention. . Patient verbalized understanding and all questions were answered. Marland Kitchen Health Maintenance issues including appropriate healthy diet, exercise, and smoking avoidance were discussed with patient. . See orders for this visit as documented in the electronic medical record.  Arnette Norris, MD  Records requested if needed. Time spent: 40 minutes, of which >50% was spent in obtaining information about her symptoms, reviewing her previous labs, evaluations, and treatments, counseling her about her condition (please see the discussed topics above), and developing a plan to further investigate it; she had a  number of questions which I addressed.   Lab Results  Component Value Date   WBC 5.8 09/20/2018   HGB 14.3 09/20/2018   HCT 43.6 09/20/2018   PLT 269 09/20/2018   GLUCOSE 90 09/20/2018   CHOL 215 (H) 02/16/2017   TRIG 97.0 02/16/2017   HDL 52.30 02/16/2017   LDLCALC 144 (H) 02/16/2017   ALT 27 09/20/2018   AST 30 09/20/2018   NA 142 09/20/2018   K 4.3 09/20/2018   CL 109 09/20/2018   CREATININE 0.62 09/20/2018   BUN 8 09/20/2018   CO2 24 09/20/2018   TSH 1.43 02/16/2017   INR 0.94 11/09/2017   HGBA1C 5.6 06/03/2018    Lab Results  Component Value Date   TSH 1.43 02/16/2017   Lab Results  Component Value Date   WBC 5.8 09/20/2018   HGB 14.3 09/20/2018   HCT 43.6 09/20/2018   MCV 99.5 09/20/2018   PLT 269 09/20/2018   Lab Results  Component Value Date   NA 142 09/20/2018   K 4.3 09/20/2018   CO2 24 09/20/2018   GLUCOSE 90 09/20/2018   BUN 8 09/20/2018   CREATININE 0.62 09/20/2018   BILITOT 0.6 09/20/2018   ALKPHOS 75 09/20/2018   AST 30 09/20/2018   ALT 27 09/20/2018   PROT 7.6 09/20/2018   ALBUMIN 4.1 09/20/2018   CALCIUM 8.9 09/20/2018   ANIONGAP 9 09/20/2018   GFR 112.42 02/16/2017   Lab Results  Component Value Date   CHOL 215 (H) 02/16/2017   Lab Results  Component Value Date   HDL 52.30 02/16/2017   Lab Results  Component Value Date   LDLCALC 144 (H) 02/16/2017   Lab Results  Component Value Date   TRIG 97.0 02/16/2017   Lab Results  Component Value Date   CHOLHDL 4 02/16/2017   Lab Results  Component Value Date   HGBA1C 5.6 06/03/2018       Assessment & Plan:   Problem List Items Addressed This Visit      Active Problems   History of 2019 novel coronavirus disease (COVID-19)    Tmax 102.3       Pneumonia due to COVID-19 virus      I am having Kayla Jacobson. Kayla Jacobson maintain her ASHWAGANDHA PO, TURMERIC PO, Cholecalciferol, Biotin, GLUCOSAMINE-CHONDROITIN PO, levalbuterol, prazosin, Ventolin HFA, budesonide-formoterol,  amphetamine-dextroamphetamine, amphetamine-dextroamphetamine, amphetamine-dextroamphetamine, alprazolam, carisoprodol, sertraline, clarithromycin, hydrOXYzine, azithromycin, lurasidone, promethazine-dextromethorphan, hydrOXYzine, and levofloxacin.  No orders of the defined types were placed in this encounter.    Arnette Norris, MD

## 2019-01-11 ENCOUNTER — Ambulatory Visit (INDEPENDENT_AMBULATORY_CARE_PROVIDER_SITE_OTHER): Payer: BC Managed Care – PPO | Admitting: Family Medicine

## 2019-01-11 DIAGNOSIS — U071 COVID-19: Secondary | ICD-10-CM | POA: Diagnosis not present

## 2019-01-11 DIAGNOSIS — J1282 Pneumonia due to coronavirus disease 2019: Secondary | ICD-10-CM

## 2019-01-11 DIAGNOSIS — Z8616 Personal history of COVID-19: Secondary | ICD-10-CM

## 2019-01-11 NOTE — Assessment & Plan Note (Signed)
She is still recovering and remains quite fatigued.  Still has productive cough, intermittent fever, malaise and body aches.  Agree she needs to be out of work,not only from a COVID stand point but from the covid related PNA diagnosed on 12.31.21. FMLA filled out and will fax it when I return to the office tomorrow.

## 2019-01-11 NOTE — Assessment & Plan Note (Signed)
Tmax 102.3

## 2019-01-12 ENCOUNTER — Ambulatory Visit: Payer: Self-pay | Admitting: Family Medicine

## 2019-01-14 ENCOUNTER — Encounter: Payer: Self-pay | Admitting: Family Medicine

## 2019-01-17 ENCOUNTER — Other Ambulatory Visit: Payer: Self-pay | Admitting: Family Medicine

## 2019-01-17 MED ORDER — PREDNISONE 20 MG PO TABS: 60.0000 mg | ORAL_TABLET | Freq: Every day | ORAL | 0 refills | Status: DC

## 2019-01-17 MED ORDER — SERTRALINE HCL 100 MG PO TABS: 150.0000 mg | ORAL_TABLET | Freq: Every day | ORAL | 3 refills | Status: DC

## 2019-01-17 MED ORDER — AMPHETAMINE-DEXTROAMPHETAMINE 30 MG PO TABS: 30.0000 mg | ORAL_TABLET | Freq: Three times a day (TID) | ORAL | 0 refills | Status: DC

## 2019-01-18 ENCOUNTER — Other Ambulatory Visit: Payer: Self-pay | Admitting: Family Medicine

## 2019-01-18 MED ORDER — AMPHETAMINE-DEXTROAMPHETAMINE 30 MG PO TABS: 30.0000 mg | ORAL_TABLET | Freq: Three times a day (TID) | ORAL | 0 refills | Status: DC

## 2019-01-20 ENCOUNTER — Other Ambulatory Visit: Payer: Self-pay | Admitting: Family Medicine

## 2019-01-20 MED ORDER — CARISOPRODOL 350 MG PO TABS: 350.0000 mg | ORAL_TABLET | Freq: Four times a day (QID) | ORAL | 5 refills | Status: DC | PRN

## 2019-01-20 MED ORDER — PREDNISONE 20 MG PO TABS: 60.0000 mg | ORAL_TABLET | Freq: Every day | ORAL | 0 refills | Status: AC

## 2019-01-23 ENCOUNTER — Other Ambulatory Visit: Payer: Self-pay | Admitting: Family Medicine

## 2019-01-23 MED ORDER — LEVOFLOXACIN 500 MG PO TABS: 500.0000 mg | ORAL_TABLET | Freq: Every day | ORAL | 0 refills | Status: DC

## 2019-02-01 ENCOUNTER — Other Ambulatory Visit: Payer: Self-pay | Admitting: Family Medicine

## 2019-02-01 MED ORDER — LISDEXAMFETAMINE DIMESYLATE 70 MG PO CAPS: 70.0000 mg | ORAL_CAPSULE | Freq: Every day | ORAL | 0 refills | Status: DC

## 2019-02-01 MED ORDER — ALPRAZOLAM 2 MG PO TABS: 2.0000 mg | ORAL_TABLET | Freq: Three times a day (TID) | ORAL | 5 refills | Status: DC | PRN

## 2019-02-02 NOTE — Telephone Encounter (Signed)
Outcome Approvedon January 14 Effective from 01/20/2019 through 01/20/2020. Drug Amphetamine-Dextroamphetamine 30MG  tablets Form [employer] [place] Prescription Drug Authorization Form  Pt aware.

## 2019-02-03 ENCOUNTER — Other Ambulatory Visit: Payer: Self-pay | Admitting: Family Medicine

## 2019-02-03 MED ORDER — ALPRAZOLAM 2 MG PO TABS: 2.0000 mg | ORAL_TABLET | Freq: Three times a day (TID) | ORAL | 5 refills | Status: DC | PRN

## 2019-02-03 MED ORDER — AMPHETAMINE-DEXTROAMPHETAMINE 30 MG PO TABS: 30.0000 mg | ORAL_TABLET | Freq: Three times a day (TID) | ORAL | 0 refills | Status: DC

## 2019-02-03 MED ORDER — LISDEXAMFETAMINE DIMESYLATE 70 MG PO CAPS: 70.0000 mg | ORAL_CAPSULE | Freq: Every day | ORAL | 0 refills | Status: DC

## 2019-02-03 MED ORDER — LURASIDONE HCL 80 MG PO TABS: 80.0000 mg | ORAL_TABLET | Freq: Every day | ORAL | 6 refills | Status: DC

## 2019-03-10 ENCOUNTER — Encounter: Payer: Self-pay | Admitting: Family Medicine

## 2019-03-10 ENCOUNTER — Ambulatory Visit (INDEPENDENT_AMBULATORY_CARE_PROVIDER_SITE_OTHER): Payer: BC Managed Care – PPO | Admitting: Family Medicine

## 2019-03-10 VITALS — BP 128/80 | HR 87 | Temp 98.1°F

## 2019-03-10 DIAGNOSIS — N644 Mastodynia: Secondary | ICD-10-CM | POA: Diagnosis not present

## 2019-03-10 DIAGNOSIS — N6452 Nipple discharge: Secondary | ICD-10-CM

## 2019-03-10 DIAGNOSIS — Z803 Family history of malignant neoplasm of breast: Secondary | ICD-10-CM

## 2019-03-10 DIAGNOSIS — G8929 Other chronic pain: Secondary | ICD-10-CM | POA: Diagnosis not present

## 2019-03-10 DIAGNOSIS — Z8049 Family history of malignant neoplasm of other genital organs: Secondary | ICD-10-CM | POA: Diagnosis not present

## 2019-03-10 NOTE — Progress Notes (Signed)
Virtual Visit via Video Note  I connected with Kayla Jacobson on 03/10/19 at 11:00 AM EST by a video enabled telemedicine application and verified that I am speaking with the correct person using two identifiers. Location patient: work Location provider:  home office Persons participating in the virtual visit: patient, provider  I discussed the limitations of evaluation and management by telemedicine and the availability of in person appointments. The patient expressed understanding and agreed to proceed.  Chief Complaint  Patient presents with  . Breast Pain    Patient is connecting today to discuss breast issue. States that she is unable to get her mammogram done due to both breasts constantly hurting with worsening pain during menstruation. Both breasts have nipple discharge since last child born 35-years-ago. Discharge was checked years ago to R/O bacterial infection.     HPI: Kayla Jacobson is a 44 y.o. female who presents today to discuss chronic B/L breat pain, which is worse during her menstrual cycle. She also endorses B/L nipple discharge x 19 years (since her last child was born). She states this was evaluated years ago and determined to be benign.  She is not able to tolerate mammogram d/t breast pain and requests lab order for BRCA panel. She is also agreeable to MRI breast. Pt has fam h/o breat cancer in [facility] and endometrial cancer in her mother.    Past Medical History:  Diagnosis Date  . ADD (attention deficit disorder)   . Anxiety   . Arthritis   . Bipolar disorder ([place])   . Chiari malformation   . Depression    bipolar  . Dysrhythmia   . Fibromyalgia   . Headache    migraines  . Seasonal asthma     Past Surgical History:  Procedure Laterality Date  . ABLATION    . CARPAL TUNNEL RELEASE     bilateral  . CHOLECYSTECTOMY    . HYSTEROSCOPY W/ ENDOMETRIAL ABLATION    . LUMBAR LAMINECTOMY/DECOMPRESSION MICRODISCECTOMY Left 11/21/2015   Procedure:  CENTRAL LUMBAR DECOMPRESSION MICRODISCECTOMY L4-L5, MICRODISECTOMY L4-L5 LEFT; FORAMINOTOMY L4 AND L5 ROOT LEFT;  Surgeon: Latanya Maudlin, MD;  Location: [facility];  Service: Orthopedics;  Laterality: Left;  . TUBAL LIGATION    . WISDOM TOOTH EXTRACTION      Family History  Problem Relation Age of Onset  . Uterine cancer Mother   . Pulmonary embolism Mother   . Anxiety disorder Mother   . Depression Mother   . Hyperlipidemia Mother   . Breast cancer Other        grandmother  . Diabetes Father   . Bipolar disorder Sister   . Hypersomnolence Sister   . Chiari malformation Sister   . Chiari malformation Brother   . Lumbar disc disease Brother   . Polymyalgia rheumatica Maternal Aunt   . ADD / ADHD Son   . Breast cancer Maternal Grandmother   . Clotting disorder Maternal Grandfather   . Lupus Paternal Grandmother   . Heart attack Paternal Grandfather     Social History   Tobacco Use  . Smoking status: Former Smoker    Types: E-cigarettes  . Smokeless tobacco: Never Used  Substance Use Topics  . Alcohol use: Yes    Comment: occasional-states 2 month  . Drug use: Never     Current Outpatient Medications:  .  alprazolam (XANAX) 2 MG tablet, Take 1 tablet (2 mg total) by mouth 3 (three) times daily as needed for anxiety or sleep. (Patient taking differently:  Take 2 mg by mouth. 1.5 tabs daily in divided doses), Disp: 90 tablet, Rfl: 5 .  amphetamine-dextroamphetamine (ADDERALL) 30 MG tablet, Take 1 tablet by mouth 3 (three) times daily. (Patient taking differently: Take 30 mg by mouth. 1.5 tabs daily in divided doses), Disp: 90 tablet, Rfl: 0 .  carisoprodol (SOMA) 350 MG tablet, Take 1 tablet (350 mg total) by mouth 4 (four) times daily as needed for muscle spasms., Disp: 120 tablet, Rfl: 5 .  hydrOXYzine (VISTARIL) 50 MG capsule, Take 1 capsule (50 mg total) by mouth at bedtime., Disp: 90 capsule, Rfl: 5 .  lisdexamfetamine (VYVANSE) 70 MG capsule, Take 1 capsule (70 mg total) by  mouth daily., Disp: 30 capsule, Rfl: 0 .  lurasidone (LATUDA) 80 MG TABS tablet, Take 1 tablet (80 mg total) by mouth daily with breakfast., Disp: 30 tablet, Rfl: 6 .  sertraline (ZOLOFT) 100 MG tablet, Take 1.5 tablets (150 mg total) by mouth daily., Disp: 135 tablet, Rfl: 3 .  VENTOLIN HFA 108 (90 Base) MCG/ACT inhaler, TAKE 2 PUFFS BY MOUTH EVERY 6 HOURS AS NEEDED, Disp: 18 g, Rfl: 2 .  budesonide-formoterol (SYMBICORT) 160-4.5 MCG/ACT inhaler, Inhale 2 puffs into the lungs 2 (two) times daily. (Patient not taking: Reported on 03/10/2019), Disp: 1 Inhaler, Rfl: 3 .  Cholecalciferol 5000 units TABS, Take 1 tablet daily by mouth., Disp: , Rfl:  .  levalbuterol (XOPENEX) 0.63 MG/3ML nebulizer solution, Take 3 mLs (0.63 mg total) by nebulization once for 1 dose., Disp: 3 mL, Rfl: 12 .  prazosin (MINIPRESS) 1 MG capsule, TAKE 1 CAPSULE BY MOUTH TWICE A DAY (Patient not taking: Reported on 03/10/2019), Disp: 45 capsule, Rfl: 1  Allergies  Allergen Reactions  . Cymbalta [Duloxetine Hcl]     SI  . Influenza Vaccines Anaphylaxis and Swelling  . Other Anaphylaxis    WALNUTS---THROAT CLOSES UP  . Advair Diskus [Fluticasone-Salmeterol] Hives  . Gabapentin Other (See Comments)    it affected her motivation.   . Trazodone And Nefazodone     "made me nuts," "I wanted to pull my hair out."      ROS: See pertinent positives and negatives per HPI.   EXAM:  VITALS per patient if applicable: BP 462/70 (BP Location: Left Arm, Patient Position: Sitting, Cuff Size: Large)   Pulse 87   Temp 98.1 F (36.7 C) (Oral)   SpO2 94%    GENERAL: alert, oriented, appears well and in no acute distress  HEENT: atraumatic, conjunctiva clear, no obvious abnormalities on inspection of external nose and ears  NECK: normal movements of the head and neck  LUNGS: on inspection no signs of respiratory distress, breathing rate appears normal, no obvious gross SOB, gasping or wheezing, no conversational dyspnea  CV:  no obvious cyanosis  PSYCH/NEURO: pleasant and cooperative, speech and thought processing grossly intact   ASSESSMENT AND PLAN: 1. Chronic breast pain 2. Bilateral nipple discharge 3. Family history of breast cancer 4. Family history of uterine cancer - BRCAvantage Plus(BRCA1,BRCA2,TP53,PTEN,CDH1,STK11,PALB2) - pt requesting this testing - MR BREAST BILATERAL WO CONTRAST; Future - pt unable to tolerate mammogram d/t chronic breast pain, however pt has fam h/o breast cancer (MGM) and uterine cancer (mother) - nipple discharge x 19 years, correlates with menstrual cycle, has been worked-up/evaluated and no non-physiologic cause was identified   I discussed the assessment and treatment plan with the patient. The patient was provided an opportunity to ask questions and all were answered. The patient agreed with the plan and demonstrated  an understanding of the instructions.   The patient was advised to call back or seek an in-person evaluation if the symptoms worsen or if the condition fails to improve as anticipated.   Letta Median, DO

## 2019-03-11 ENCOUNTER — Ambulatory Visit: Payer: BC Managed Care – PPO | Admitting: Family Medicine

## 2019-03-14 ENCOUNTER — Encounter: Payer: Self-pay | Admitting: Family Medicine

## 2019-03-14 ENCOUNTER — Telehealth: Payer: Self-pay | Admitting: Family Medicine

## 2019-03-14 NOTE — Telephone Encounter (Signed)
Kayla Jacobson is calling and is wanting to let Dr. Bryan Lemma know that she is faxing over PA for the Brca Panel Plus. CB [phone]

## 2019-03-14 NOTE — Telephone Encounter (Signed)
Please see message and advise.  Thank you. ° °

## 2019-03-16 MED ORDER — LURASIDONE HCL 40 MG PO TABS: 40.0000 mg | ORAL_TABLET | Freq: Every day | ORAL | 5 refills | Status: DC

## 2019-03-16 NOTE — Telephone Encounter (Signed)
Note signed 

## 2019-03-18 NOTE — Telephone Encounter (Signed)
[facility] is calling because they still need PA for the Brca Panel Plus. Updates needs to be in my 3/16.CB is [phone]

## 2019-03-18 NOTE — Telephone Encounter (Signed)
I spoke with Magda Paganini at [facility] in [employer] sent.  Magda Paganini gave me the # for third part of pt's insurance to get a PA for the specimen collected on 03/10/19.  I spoke with a Apolonio Schneiders from [employer], the 3rd party insurance to initiate the PA.  Once questions were answered the PA was approved.   Authorization # [id number] lasting from 03/10/19-06/07/2019.

## 2019-03-18 NOTE — Telephone Encounter (Signed)
I called [facility] back and spoke with Danae Chen to give her the approved authorization number from [employer] along with the CPT code [id number] for 1 unit.  Erica informed me that the order will be processed.

## 2019-03-29 ENCOUNTER — Telehealth: Payer: Self-pay

## 2019-03-29 NOTE — Telephone Encounter (Signed)
I spoke with patient and she informed me that she got a call to schedule her breast MRI but they said that it is "protochol" to do a diagnostic mammogram first. She said she can't do the mammogram because of the chronic breast pain that she have! She need her order to be sent to another place that will do the breast MRI like Dr. Loletha Grayer ordered.  Please advise.

## 2019-03-31 NOTE — Telephone Encounter (Signed)
Pt states she is unable to tolerate mammogram d/t pain. Insurance has denied breast MRI so it will not be covered. Peer-to-peer review done and denied. It does not appear this will be able to be done.

## 2019-04-01 ENCOUNTER — Other Ambulatory Visit: Payer: Self-pay | Admitting: Family Medicine

## 2019-04-01 DIAGNOSIS — N644 Mastodynia: Secondary | ICD-10-CM

## 2019-04-04 LAB — BRCAVANTAGE PLUS(BRCA1,BRCA2,TP53,PTEN,CDH1,STK11,PALB2)
Clinical Interpretation: NEGATIVE
Result: NEGATIVE

## 2019-04-05 ENCOUNTER — Ambulatory Visit (INDEPENDENT_AMBULATORY_CARE_PROVIDER_SITE_OTHER): Payer: BC Managed Care – PPO

## 2019-04-05 ENCOUNTER — Other Ambulatory Visit: Payer: Self-pay | Admitting: Family Medicine

## 2019-04-05 ENCOUNTER — Other Ambulatory Visit: Payer: Self-pay

## 2019-04-05 DIAGNOSIS — S99912A Unspecified injury of left ankle, initial encounter: Secondary | ICD-10-CM | POA: Diagnosis not present

## 2019-04-05 DIAGNOSIS — S99922A Unspecified injury of left foot, initial encounter: Secondary | ICD-10-CM | POA: Diagnosis not present

## 2019-04-05 DIAGNOSIS — M25572 Pain in left ankle and joints of left foot: Secondary | ICD-10-CM | POA: Diagnosis not present

## 2019-04-05 NOTE — Progress Notes (Signed)
[email or web address]

## 2019-07-12 ENCOUNTER — Other Ambulatory Visit: Payer: Self-pay

## 2019-07-12 MED ORDER — LISDEXAMFETAMINE DIMESYLATE 70 MG PO CAPS: 70.0000 mg | ORAL_CAPSULE | Freq: Every day | ORAL | 0 refills | Status: DC

## 2019-07-12 NOTE — Telephone Encounter (Signed)
Rx refilled. F/u prior to next med refill

## 2019-07-12 NOTE — Telephone Encounter (Signed)
Last OV 03/10/2019 Last fill 02/03/19  #30/Pt would like to when she should schedule an appointment for f/u.

## 2019-07-21 ENCOUNTER — Encounter: Payer: Self-pay | Admitting: Family Medicine

## 2019-07-21 ENCOUNTER — Telehealth (INDEPENDENT_AMBULATORY_CARE_PROVIDER_SITE_OTHER): Payer: BC Managed Care – PPO | Admitting: Family Medicine

## 2019-07-21 VITALS — BP 122/76 | HR 72 | Temp 98.2°F | Ht 65.0 in | Wt 285.0 lb

## 2019-07-21 DIAGNOSIS — F988 Other specified behavioral and emotional disorders with onset usually occurring in childhood and adolescence: Secondary | ICD-10-CM

## 2019-07-21 DIAGNOSIS — F419 Anxiety disorder, unspecified: Secondary | ICD-10-CM | POA: Diagnosis not present

## 2019-07-21 DIAGNOSIS — F339 Major depressive disorder, recurrent, unspecified: Secondary | ICD-10-CM | POA: Diagnosis not present

## 2019-07-21 DIAGNOSIS — G8929 Other chronic pain: Secondary | ICD-10-CM

## 2019-07-21 DIAGNOSIS — R053 Chronic cough: Secondary | ICD-10-CM

## 2019-07-21 DIAGNOSIS — M546 Pain in thoracic spine: Secondary | ICD-10-CM | POA: Diagnosis not present

## 2019-07-21 DIAGNOSIS — R05 Cough: Secondary | ICD-10-CM

## 2019-07-21 MED ORDER — ALPRAZOLAM 2 MG PO TABS: 2.0000 mg | ORAL_TABLET | Freq: Two times a day (BID) | ORAL | 2 refills | Status: DC

## 2019-07-21 MED ORDER — LISDEXAMFETAMINE DIMESYLATE 70 MG PO CAPS: 70.0000 mg | ORAL_CAPSULE | Freq: Every day | ORAL | 0 refills | Status: DC

## 2019-07-21 MED ORDER — ALBUTEROL SULFATE HFA 108 (90 BASE) MCG/ACT IN AERS: 2.0000 | INHALATION_SPRAY | RESPIRATORY_TRACT | 2 refills | Status: DC | PRN

## 2019-07-21 MED ORDER — CARISOPRODOL 350 MG PO TABS: 350.0000 mg | ORAL_TABLET | Freq: Four times a day (QID) | ORAL | 5 refills | Status: DC | PRN

## 2019-07-21 MED ORDER — SERTRALINE HCL 100 MG PO TABS: 150.0000 mg | ORAL_TABLET | Freq: Every day | ORAL | 3 refills | Status: DC

## 2019-07-21 NOTE — Progress Notes (Signed)
Virtual Visit via Video Note  I connected with [name] on 07/21/19 at  4:00 PM EDT by a video enabled telemedicine application and verified that I am speaking with the correct person using two identifiers. Location patient: home Location provider: work  Persons participating in the virtual visit: patient, provider  I discussed the limitations of evaluation and management by telemedicine and the availability of in person appointments. The patient expressed understanding and agreed to proceed.  Chief Complaint  Patient presents with  . Establish Care    Patient is needing to transfer care from Dr. Deborra Medina to Dr. Bryan Lemma. Purpose of visit today is medication management. She is treated for ADHD with Vyvanse and is compliant. She is also being treated for anxiety with Sertraline and Alprazolam and states that this combination is working well for her.  She is also treated for her chronic back pain and is S/P laminectomy and microdiscectomy L4-5 and manages her pain with a combination of Soma and alternating Ibuprofen and Tylenol which seems to work well fo     HPI: Kayla Jacobson is a 44 y.o. female seen today for [facility] appt, previous PCP Dr. Deborra Medina, and routine f/u on chronic medical issues including ADD, anxiety, depression, chronic back pain. She needs refills of her meds. They are all chronic, effective, no side effects. Pt is stable. No acute issues or concerns.     Past Medical History:  Diagnosis Date  . ADD (attention deficit disorder)   . Anxiety   . Arthritis   . Bipolar disorder ([place])   . Chiari malformation   . Depression    bipolar  . Dysrhythmia   . Fibromyalgia   . Headache    migraines  . Seasonal asthma     Past Surgical History:  Procedure Laterality Date  . ABLATION    . CARPAL TUNNEL RELEASE     bilateral  . CHOLECYSTECTOMY    . HYSTEROSCOPY W/ ENDOMETRIAL ABLATION    . LUMBAR LAMINECTOMY/DECOMPRESSION MICRODISCECTOMY Left 11/21/2015   Procedure:  CENTRAL LUMBAR DECOMPRESSION MICRODISCECTOMY L4-L5, MICRODISECTOMY L4-L5 LEFT; FORAMINOTOMY L4 AND L5 ROOT LEFT;  Surgeon: Latanya Maudlin, MD;  Location: [facility];  Service: Orthopedics;  Laterality: Left;  . TUBAL LIGATION    . WISDOM TOOTH EXTRACTION      Family History  Problem Relation Age of Onset  . Uterine cancer Mother   . Pulmonary embolism Mother   . Anxiety disorder Mother   . Depression Mother   . Hyperlipidemia Mother   . Breast cancer Other        grandmother  . Diabetes Father   . Bipolar disorder Sister   . Hypersomnolence Sister   . Chiari malformation Sister   . Chiari malformation Brother   . Lumbar disc disease Brother   . Polymyalgia rheumatica Maternal Aunt   . ADD / ADHD Son   . Breast cancer Maternal Grandmother   . Clotting disorder Maternal Grandfather   . Lupus Paternal Grandmother   . Heart attack Paternal Grandfather     Social History   Tobacco Use  . Smoking status: Former Smoker    Types: E-cigarettes  . Smokeless tobacco: Never Used  Vaping Use  . Vaping Use: Former  Substance Use Topics  . Alcohol use: Yes    Comment: occasional-states 2 month  . Drug use: Never     Current Outpatient Medications:  .  alprazolam (XANAX) 2 MG tablet, Take 1 tablet (2 mg total) by mouth 3 (three)  times daily as needed for anxiety or sleep. (Patient taking differently: Take 2 mg by mouth. 1.5 tabs daily in divided doses), Disp: 90 tablet, Rfl: 5 .  amphetamine-dextroamphetamine (ADDERALL) 30 MG tablet, Take 1 tablet by mouth 3 (three) times daily. (Patient taking differently: Take 30 mg by mouth. 1.5 tabs daily in divided doses), Disp: 90 tablet, Rfl: 0 .  budesonide-formoterol (SYMBICORT) 160-4.5 MCG/ACT inhaler, Inhale 2 puffs into the lungs 2 (two) times daily., Disp: 1 Inhaler, Rfl: 3 .  carisoprodol (SOMA) 350 MG tablet, Take 1 tablet (350 mg total) by mouth 4 (four) times daily as needed for muscle spasms., Disp: 120 tablet, Rfl: 5 .  Cholecalciferol  5000 units TABS, Take 1 tablet daily by mouth., Disp: , Rfl:  .  hydrOXYzine (VISTARIL) 50 MG capsule, Take 1 capsule (50 mg total) by mouth at bedtime., Disp: 90 capsule, Rfl: 5 .  lisdexamfetamine (VYVANSE) 70 MG capsule, Take 1 capsule (70 mg total) by mouth daily., Disp: 30 capsule, Rfl: 0 .  lurasidone (LATUDA) 40 MG TABS tablet, Take 1 tablet (40 mg total) by mouth daily with breakfast., Disp: 30 tablet, Rfl: 5 .  sertraline (ZOLOFT) 100 MG tablet, Take 1.5 tablets (150 mg total) by mouth daily., Disp: 135 tablet, Rfl: 3 .  VENTOLIN HFA 108 (90 Base) MCG/ACT inhaler, TAKE 2 PUFFS BY MOUTH EVERY 6 HOURS AS NEEDED, Disp: 18 g, Rfl: 2 .  levalbuterol (XOPENEX) 0.63 MG/3ML nebulizer solution, Take 3 mLs (0.63 mg total) by nebulization once for 1 dose., Disp: 3 mL, Rfl: 12 .  prazosin (MINIPRESS) 1 MG capsule, TAKE 1 CAPSULE BY MOUTH TWICE A DAY (Patient not taking: Reported on 03/10/2019), Disp: 45 capsule, Rfl: 1  Allergies  Allergen Reactions  . Cymbalta [Duloxetine Hcl]     SI  . Influenza Vaccines Anaphylaxis and Swelling  . Other Anaphylaxis    WALNUTS---THROAT CLOSES UP  . Advair Diskus [Fluticasone-Salmeterol] Hives  . Gabapentin Other (See Comments)    it affected her motivation.   . Trazodone And Nefazodone     "made me nuts," "I wanted to pull my hair out."      ROS: See pertinent positives and negatives per HPI.   EXAM:  VITALS per patient if applicable: BP 245/80   Pulse 72   Temp 98.2 F (36.8 C) (Temporal)   Ht 5\' 5"  (1.651 m)   Wt 285 lb (129.3 kg)   SpO2 98%   BMI 47.43 kg/m    GENERAL: alert, oriented, appears well and in no acute distress  NECK: normal movements of the head and neck  LUNGS: on inspection no signs of respiratory distress, breathing rate appears normal, no obvious gross SOB, gasping or wheezing, no conversational dyspnea  CV: no obvious cyanosis  PSYCH/NEURO: pleasant and cooperative, no obvious depression or anxiety, speech and  thought processing grossly intact   ASSESSMENT AND PLAN: 1. Attention deficit disorder, unspecified hyperactivity presence - stable, controlled - database reviewed and appropriate - needs UDS and controlled substance agreement at next in-person OV Refill: - lisdexamfetamine (VYVANSE) 70 MG capsule; Take 1 capsule (70 mg total) by mouth daily.  Dispense: 30 capsule; Refill: 0  2. Depression, recurrent ([place]) - controlled Refill: - sertraline (ZOLOFT) 100 MG tablet; Take 1.5 tablets (150 mg total) by mouth daily.  Dispense: 135 tablet; Refill: 3  3. Anxiety disorder, unspecified type - stable, controlled - database reviewed and appropriate - needs UDS and controlled substance agreement at next in-person OV Refill: - alprazolam (  XANAX) 2 MG tablet; Take 1 tablet (2 mg total) by mouth in the morning and at bedtime.  Dispense: 60 tablet; Refill: 2 - sertraline (ZOLOFT) 100 MG tablet; Take 1.5 tablets (150 mg total) by mouth daily.  Dispense: 135 tablet; Refill: 3  4. Chronic bilateral thoracic back pain - managed with tylenol, ibuprofen, and soma PRN - database reviewed and appropriate - needs UDS and controlled substance agreement at next in-person OV Refill: - carisoprodol (SOMA) 350 MG tablet; Take 1 tablet (350 mg total) by mouth 4 (four) times daily as needed for muscle spasms.  Dispense: 120 tablet; Refill: 5  5. Persistent cough - improved Refill: - albuterol (VENTOLIN HFA) 108 (90 Base) MCG/ACT inhaler; Inhale 2 puffs into the lungs every 4 (four) hours as needed for wheezing or shortness of breath.  Dispense: 18 g; Refill: 2   Due for f/u in 3 mo or sooner PRN    I discussed the assessment and treatment plan with the patient. The patient was provided an opportunity to ask questions and all were answered. The patient agreed with the plan and demonstrated an understanding of the instructions.   The patient was advised to call back or seek an in-person evaluation if the  symptoms worsen or if the condition fails to improve as anticipated.   Letta Median, DO

## 2019-08-02 ENCOUNTER — Ambulatory Visit: Payer: BC Managed Care – PPO | Admitting: Family Medicine

## 2019-08-02 ENCOUNTER — Other Ambulatory Visit: Payer: Self-pay

## 2019-08-02 ENCOUNTER — Encounter: Payer: Self-pay | Admitting: Family Medicine

## 2019-08-02 VITALS — BP 122/76 | HR 78 | Temp 98.2°F | Ht 65.0 in | Wt 285.0 lb

## 2019-08-02 DIAGNOSIS — M7582 Other shoulder lesions, left shoulder: Secondary | ICD-10-CM | POA: Diagnosis not present

## 2019-08-02 NOTE — Patient Instructions (Signed)
Rotator Cuff Tendinitis  Rotator cuff tendinitis is inflammation of the tough, cord-like bands that connect muscle to bone (tendons) in the rotator cuff. The rotator cuff includes all of the muscles and tendons that connect the arm to the shoulder. The rotator cuff holds the head of the upper arm bone (humerus) in the cup (fossa) of the shoulder blade (scapula). This condition can lead to a long-lasting (chronic) tear. The tear may be partial or complete. What are the causes? This condition is usually caused by overusing the rotator cuff. What increases the risk? This condition is more likely to develop in athletes and workers who frequently use their shoulder or reach over their heads. This can include activities such as:  Tennis.  Baseball or softball.  Swimming.  Construction work.  Painting. What are the signs or symptoms? Symptoms of this condition include:  Pain spreading (radiating) from the shoulder to the upper arm.  Swelling and tenderness in front of the shoulder.  Pain when reaching, pulling, or lifting the arm above the head.  Pain when lowering the arm from above the head.  Minor pain in the shoulder when resting.  Increased pain in the shoulder at night.  Difficulty placing the arm behind the back. How is this diagnosed? This condition is diagnosed with a medical history and physical exam. Tests may also be done, including:  X-rays.  MRI.  Ultrasounds.  CT or MR arthrogram. During this test, a contrast material is injected and then images are taken. How is this treated? Treatment for this condition depends on the severity of the condition. In less severe cases, treatment may include:  Rest. This may be done with a sling that holds the shoulder still (immobilization). Your health care provider may also recommend avoiding activities that involve lifting your arm over your head.  Icing the shoulder.  Anti-inflammatory medicines, such as aspirin or  ibuprofen. In more severe cases, treatment may include:  Physical therapy.  Steroid injections.  Surgery. Follow these instructions at home: If you have a sling:  Wear the sling as told by your health care provider. Remove it only as told by your health care provider.  Loosen the sling if your fingers tingle, become numb, or turn cold and blue.  Keep the sling clean.  If the sling is not waterproof, do not let it get wet. Remove it, if allowed, or cover it with a watertight covering when you take a bath or shower. Managing pain, stiffness, and swelling  If directed, put ice on the injured area. ? If you have a removable sling, remove it as told by your health care provider. ? Put ice in a plastic bag. ? Place a towel between your skin and the bag. ? Leave the ice on for 20 minutes, 2-3 times a day.  Move your fingers often to avoid stiffness and to lessen swelling.  Raise (elevate) the injured area above the level of your heart while you are lying down.  Find a comfortable sleeping position or sleep on a recliner, if available. Driving  Do not drive or use heavy machinery while taking prescription pain medicine.  Ask your health care provider when it is safe to drive if you have a sling on your arm. Activity  Rest your shoulder as told by your health care provider.  Return to your normal activities as told by your health care provider. Ask your health care provider what activities are safe for you.  Do any exercises or stretches as   told by your health care provider.  If you do repetitive overhead tasks, take small breaks in between and include stretching exercises as told by your health care provider. General instructions  Do not use any products that contain nicotine or tobacco, such as cigarettes and e-cigarettes. These can delay healing. If you need help quitting, ask your health care provider.  Take over-the-counter and prescription medicines only as told by your  health care provider.  Keep all follow-up visits as told by your health care provider. This is important. Contact a health care provider if:  Your pain gets worse.  You have new pain in your arm, hands, or fingers.  Your pain is not relieved with medicine or does not get better after 6 weeks of treatment.  You have cracking sensations when moving your shoulder in certain directions.  You hear a snapping sound after using your shoulder, followed by severe pain and weakness. Get help right away if:  Your arm, hand, or fingers are numb or tingling.  Your arm, hand, or fingers are swollen or painful or they turn white or blue. Summary  Rotator cuff tendinitis is inflammation of the tough, cord-like bands that connect muscle to bone (tendons) in the rotator cuff.  This condition is usually caused by overusing the rotator cuff, which includes all of the muscles and tendons that connect the arm to the shoulder.  This condition is more likely to develop in athletes and workers who frequently use their shoulder or reach over their heads.  Treatment generally includes rest, anti-inflammatory medicines, and icing. In some cases, physical therapy and steroid injections may be needed. In severe cases, surgery may be needed. This information is not intended to replace advice given to you by your health care provider. Make sure you discuss any questions you have with your health care provider. Document Revised: 04/16/2018 Document Reviewed: 12/10/2015 Elsevier Patient Education  [place].  Try some icing 20 to 30 minutes couple times a day over the next few days  Gentle range of motion as tolerated  Touch base if not seeing improvements over the next few days

## 2019-08-02 NOTE — Progress Notes (Signed)
Established Patient Office Visit  Subjective:  Patient ID: Kayla Jacobson, female    DOB: 1975/07/11  Age: 44 y.o. MRN: [id number]  CC:  Chief Complaint  Patient presents with  . Shoulder Pain    left shoulder pain x 3 months, decreased range of motion, denies any injury    HPI Kayla Jacobson presents for left shoulder pain for the past few months.  She has pain with abduction and internal rotation.  This has been progressive over 3 months.  There is no history of injury.  No significant neck pain.  She started to have more night pain especially when she rolls onto that side.  Pain is intense at times.  She has tried some ibuprofen without much improvement.  She apparently had similar problem with the right shoulder in the past and eventually had injection for that.  Past Medical History:  Diagnosis Date  . ADD (attention deficit disorder)   . Anxiety   . Arthritis   . Bipolar disorder ([place])   . Chiari malformation   . Depression    bipolar  . Dysrhythmia   . Fibromyalgia   . Headache    migraines  . Seasonal asthma     Past Surgical History:  Procedure Laterality Date  . ABLATION    . CARPAL TUNNEL RELEASE     bilateral  . CHOLECYSTECTOMY    . HYSTEROSCOPY W/ ENDOMETRIAL ABLATION    . LUMBAR LAMINECTOMY/DECOMPRESSION MICRODISCECTOMY Left 11/21/2015   Procedure: CENTRAL LUMBAR DECOMPRESSION MICRODISCECTOMY L4-L5, MICRODISECTOMY L4-L5 LEFT; FORAMINOTOMY L4 AND L5 ROOT LEFT;  Surgeon: Latanya Maudlin, MD;  Location: [facility];  Service: Orthopedics;  Laterality: Left;  . TUBAL LIGATION    . WISDOM TOOTH EXTRACTION      Family History  Problem Relation Age of Onset  . Uterine cancer Mother   . Pulmonary embolism Mother   . Anxiety disorder Mother   . Depression Mother   . Hyperlipidemia Mother   . Breast cancer Other        grandmother  . Diabetes Father   . Bipolar disorder Sister   . Hypersomnolence Sister   . Chiari malformation Sister   . Chiari  malformation Brother   . Lumbar disc disease Brother   . Polymyalgia rheumatica Maternal Aunt   . ADD / ADHD Son   . Breast cancer Maternal Grandmother   . Clotting disorder Maternal Grandfather   . Lupus Paternal Grandmother   . Heart attack Paternal Grandfather     Social History   Socioeconomic History  . Marital status: Married    Spouse name: Not on file  . Number of children: Not on file  . Years of education: Not on file  . Highest education level: Not on file  Occupational History  . Not on file  Tobacco Use  . Smoking status: Former Smoker    Types: E-cigarettes  . Smokeless tobacco: Never Used  Vaping Use  . Vaping Use: Former  Substance and Sexual Activity  . Alcohol use: Yes    Comment: occasional-states 2 month  . Drug use: Never  . Sexual activity: Not on file  Other Topics Concern  . Not on file  Social History Narrative  . Not on file   Social Determinants of Health   Financial Resource Strain:   . Difficulty of Paying Living Expenses:   Food Insecurity:   . Worried About Charity fundraiser in the Last Year:   . [employer]  in the Last Year:   Transportation Needs:   . Film/video editor (Medical):   Marland Kitchen Lack of Transportation (Non-Medical):   Physical Activity:   . Days of Exercise per Week:   . Minutes of Exercise per Session:   Stress:   . Feeling of Stress :   Social Connections:   . Frequency of Communication with Friends and Family:   . Frequency of Social Gatherings with Friends and Family:   . Attends Religious Services:   . Active Member of Clubs or Organizations:   . Attends Archivist Meetings:   Marland Kitchen Marital Status:   Intimate Partner Violence:   . Fear of Current or Ex-Partner:   . Emotionally Abused:   Marland Kitchen Physically Abused:   . Sexually Abused:     Outpatient Medications Prior to Visit  Medication Sig Dispense Refill  . albuterol (VENTOLIN HFA) 108 (90 Base) MCG/ACT inhaler Inhale 2 puffs into the lungs  every 4 (four) hours as needed for wheezing or shortness of breath. 18 g 2  . alprazolam (XANAX) 2 MG tablet Take 1 tablet (2 mg total) by mouth in the morning and at bedtime. 60 tablet 2  . budesonide-formoterol (SYMBICORT) 160-4.5 MCG/ACT inhaler Inhale 2 puffs into the lungs 2 (two) times daily. 1 Inhaler 3  . carisoprodol (SOMA) 350 MG tablet Take 1 tablet (350 mg total) by mouth 4 (four) times daily as needed for muscle spasms. 120 tablet 5  . Cholecalciferol 5000 units TABS Take 1 tablet daily by mouth.    . hydrOXYzine (VISTARIL) 50 MG capsule Take 1 capsule (50 mg total) by mouth at bedtime. 90 capsule 5  . lisdexamfetamine (VYVANSE) 70 MG capsule Take 1 capsule (70 mg total) by mouth daily. 30 capsule 0  . sertraline (ZOLOFT) 100 MG tablet Take 1.5 tablets (150 mg total) by mouth daily. 135 tablet 3  . levalbuterol (XOPENEX) 0.63 MG/3ML nebulizer solution Take 3 mLs (0.63 mg total) by nebulization once for 1 dose. 3 mL 12   No facility-administered medications prior to visit.    Allergies  Allergen Reactions  . Cymbalta [Duloxetine Hcl]     SI  . Influenza Vaccines Anaphylaxis and Swelling  . Other Anaphylaxis    WALNUTS---THROAT CLOSES UP  . Advair Diskus [Fluticasone-Salmeterol] Hives  . Gabapentin Other (See Comments)    it affected her motivation.   . Trazodone And Nefazodone     "made me nuts," "I wanted to pull my hair out."    ROS Review of Systems  Neurological: Negative for weakness and numbness.      Objective:    Physical Exam Cardiovascular:     Rate and Rhythm: Normal rate and regular rhythm.  Pulmonary:     Effort: Pulmonary effort is normal.     Breath sounds: Normal breath sounds.  Musculoskeletal:     Comments: Left shoulder reveals some subacromial tenderness.  She has pain with abduction greater than 90 degrees and also pain with internal rotation.  No acromioclavicular tenderness.  No biceps tenderness.  No visible swelling.  Neurological:      Comments: No appreciable rotator cuff weakness     BP 122/76   Pulse 78   Temp 98.2 F (36.8 C) (Oral)   Ht 5\' 5"  (1.651 m)   Wt (!) 285 lb (129.3 kg)   SpO2 96%   BMI 47.43 kg/m  Wt Readings from Last 3 Encounters:  08/02/19 (!) 285 lb (129.3 kg)  07/21/19 285 lb (129.3 kg)  05/19/18 290 lb 6.4 oz (131.7 kg)     Health Maintenance Due  Topic Date Due  . Hepatitis C Screening  Never done  . COVID-19 Vaccine (1) Never done    There are no preventive care reminders to display for this patient.  Lab Results  Component Value Date   TSH 1.43 02/16/2017   Lab Results  Component Value Date   WBC 5.8 09/20/2018   HGB 14.3 09/20/2018   HCT 43.6 09/20/2018   MCV 99.5 09/20/2018   PLT 269 09/20/2018   Lab Results  Component Value Date   NA 142 09/20/2018   K 4.3 09/20/2018   CO2 24 09/20/2018   GLUCOSE 90 09/20/2018   BUN 8 09/20/2018   CREATININE 0.62 09/20/2018   BILITOT 0.6 09/20/2018   ALKPHOS 75 09/20/2018   AST 30 09/20/2018   ALT 27 09/20/2018   PROT 7.6 09/20/2018   ALBUMIN 4.1 09/20/2018   CALCIUM 8.9 09/20/2018   ANIONGAP 9 09/20/2018   GFR 112.42 02/16/2017   Lab Results  Component Value Date   CHOL 215 (H) 02/16/2017   Lab Results  Component Value Date   HDL 52.30 02/16/2017   Lab Results  Component Value Date   LDLCALC 144 (H) 02/16/2017   Lab Results  Component Value Date   TRIG 97.0 02/16/2017   Lab Results  Component Value Date   CHOLHDL 4 02/16/2017   Lab Results  Component Value Date   HGBA1C 5.6 06/03/2018      Assessment & Plan:   Progressive left shoulder pain for several months.  Suspect rotator cuff tendinitis.  Doubt rotator cuff tear Unimproved with ibuprofen  - Discussed risks and benefits of corticosteroid injection including risk of bleeding, bruising, low risk of infection and patient consented.  After prepping skin with betadine, injected 80 mg depomedrol and 2 cc of plain xylocaine with 25 gauge one and one  half inch needle using posterior lateral approach and pt tolerated well. -Gentle range of motion as tolerated -Recommend icing 20 to 30 minutes couple times daily for next couple of days -Touch base by next week if not improving   No orders of the defined types were placed in this encounter.   Follow-up: No follow-ups on file.    Carolann Littler, MD

## 2019-08-08 DIAGNOSIS — M25512 Pain in left shoulder: Secondary | ICD-10-CM | POA: Diagnosis not present

## 2019-08-08 DIAGNOSIS — M542 Cervicalgia: Secondary | ICD-10-CM | POA: Diagnosis not present

## 2019-08-08 NOTE — Addendum Note (Signed)
Addended by: Zacarias Pontes on: 08/08/2019 05:09 PM   Modules accepted: Orders

## 2019-08-12 MED ORDER — METHYLPREDNISOLONE ACETATE 80 MG/ML IJ SUSP
80.0000 mg | Freq: Once | INTRAMUSCULAR | Status: AC
  Administered 2019-09-07: 80 mg via INTRAMUSCULAR

## 2019-08-12 NOTE — Addendum Note (Signed)
Addended by: Zacarias Pontes on: 08/12/2019 04:52 PM   Modules accepted: Orders

## 2019-08-16 ENCOUNTER — Encounter: Payer: Self-pay | Admitting: Family Medicine

## 2019-08-16 DIAGNOSIS — Z91018 Allergy to other foods: Secondary | ICD-10-CM

## 2019-08-17 MED ORDER — EPINEPHRINE 0.3 MG/0.3ML IJ SOAJ: 0.3000 mg | INTRAMUSCULAR | 0 refills | Status: DC | PRN

## 2019-08-17 NOTE — Telephone Encounter (Signed)
Rx placed.

## 2019-08-19 ENCOUNTER — Encounter: Payer: Self-pay | Admitting: Family Medicine

## 2019-08-19 DIAGNOSIS — M7582 Other shoulder lesions, left shoulder: Secondary | ICD-10-CM

## 2019-08-31 ENCOUNTER — Encounter: Payer: Self-pay | Admitting: Family Medicine

## 2019-09-01 ENCOUNTER — Ambulatory Visit: Payer: Self-pay

## 2019-09-01 ENCOUNTER — Encounter: Payer: Self-pay | Admitting: Family Medicine

## 2019-09-01 ENCOUNTER — Ambulatory Visit: Payer: BC Managed Care – PPO | Admitting: Family Medicine

## 2019-09-01 ENCOUNTER — Other Ambulatory Visit: Payer: Self-pay

## 2019-09-01 VITALS — BP 124/88 | HR 70 | Ht 67.0 in | Wt 278.4 lb

## 2019-09-01 DIAGNOSIS — M25512 Pain in left shoulder: Secondary | ICD-10-CM

## 2019-09-01 NOTE — Progress Notes (Signed)
Subjective:    I'm seeing this patient as a consultation for:  Dr. Elease Hashimoto. Note will be routed back to referring provider/PCP.  CC: L shoulder pain  I, Molly Weber, LAT, ATC, am serving as scribe for Dr. Lynne Leader.  HPI: Pt is a 44 y/o female presenting w/ c/o L shoulder pain x approximately 5 months w/ no known MOI that has been progressively worsening.  She locates the pain to her L post-lat shoulder.  She was previously seen at [facility] in July 2021 for this issue and was prescribed prednisone and Meloxicam.  Radiating pain: yes into the L wrist occasionally down to the wrist L shoulder mechanical symptoms: No Aggravating factors: attempts at L shoulder AROM above 90 deg and functional IR; difficulty donning/doffing clothes, particularly her bra Treatments tried: prednisone; Meloxicam; IBU; Tylenol; roll-on Lidocaine; ice  Diagnostic testing: L shoulder XR at [facility] in July 2021;   Past medical history, Surgical history, Family history, Social history, Allergies, and medications have been entered into the medical record, reviewed.   Review of Systems: No new headache, visual changes, nausea, vomiting, diarrhea, constipation, dizziness, abdominal pain, skin rash, fevers, chills, night sweats, weight loss, swollen lymph nodes, body aches, joint swelling, muscle aches, chest pain, shortness of breath, mood changes, visual or auditory hallucinations.   Objective:    Vitals:   09/01/19 1026  BP: 124/88  Pulse: 70  SpO2: 97%   General: Well Developed, well nourished, and in no acute distress.  Neuro/Psych: Alert and oriented x3, extra-ocular muscles intact, able to move all 4 extremities, sensation grossly intact. Skin: Warm and dry, no rashes noted.  Respiratory: Not using accessory muscles, speaking in full sentences, trachea midline.  Cardiovascular: Pulses palpable, no extremity edema. Abdomen: Does not appear distended. MSK: Left shoulder normal-appearing  nontender. Significant guarding and lack of range of motion.   Range of motion: Abduction 100 degrees external rotation 20 degrees by neutral position.  Internal rotation iliac crest. Strength within limits of range of motion intact 5/5 abduction external/internal rotation. Negative Yergason's and speeds test. Unable to get the correct positioning for Hawkins and Neer's testing nondiagnostic. Positive empty can test.  Lab and Radiology Results Diagnostic Limited MSK Ultrasound of: Left shoulder Biceps tendon intact normal-appearing Subscapularis tendon is intact without significant abnormality. Supraspinatus tendon is thinned and somewhat heterogeneous appearance without obvious tear. Mild increased subacromial bursa thickness. Infraspinatus tendon intact. AC joint mild effusion.. Impression: Mild subacromial bursitis.  Moderate supraspinatus tendinopathy with possible partial-thickness tear.  Procedure: Real-time Ultrasound Guided Injection of left shoulder glenohumeral joint Device: Philips Affiniti 50G Images permanently stored and available for review in PACS Verbal informed consent obtained.  Discussed risks and benefits of procedure. Warned about infection bleeding damage to structures skin hypopigmentation and fat atrophy among others. Patient expresses understanding and agreement Time-out conducted.   Noted no overlying erythema, induration, or other signs of local infection.   Skin prepped in a sterile fashion.   Local anesthesia: Topical Ethyl chloride.   With sterile technique and under real time ultrasound guidance:  40 mg of Kenalog and 2 L of Marcaine injected easily.   Completed without difficulty   Pain did not immediately resolve Advised to call if fevers/chills, erythema, induration, drainage, or persistent bleeding.   Images permanently stored and available for review in the ultrasound unit.  Impression: Technically successful ultrasound guided  injection.        Impression and Recommendations:    Assessment and Plan: 44 y.o. female  with left shoulder pain.  Selinda Eon did not have great response to injection in clinic today with me.  Additionally she did not have great response to subacromial injection performed earlier by PCP.  She has significant pain and guarding and is failing conservative management for greater than 6 weeks.  X-ray done at outside orthopedic location were normal. This point proceed with MRI.  We will also place an order for physical therapy which will likely be started after MRI results are back.  MRI will dictate further treatment including possible further injections or surgery or PT protocol.  Recheck after MRI.  Orders Placed This Encounter  Procedures  . [place] LIMITED JOINT SPACE STRUCTURES UP LEFT(NO LINKED CHARGES)    Order Specific Question:   Reason for Exam (SYMPTOM  OR DIAGNOSIS REQUIRED)    Answer:   L shoulder pain    Order Specific Question:   Preferred imaging location?    Answer:   [place]  . MR SHOULDER LEFT WO CONTRAST    Standing Status:   Future    Standing Expiration Date:   08/31/2020    Order Specific Question:   What is the patient's sedation requirement?    Answer:   No Sedation    Order Specific Question:   Does the patient have a pacemaker or implanted devices?    Answer:   No    Order Specific Question:   Preferred imaging location?    Answer:   [facility] (table limit-350lbs)    Order Specific Question:   Radiology Contrast Protocol - do NOT remove file path    Answer:   [email or web address]  . Ambulatory referral to Physical Therapy    Referral Priority:   Routine    Referral Type:   Physical Medicine    Referral Reason:   Specialty Services Required    Requested Specialty:   Physical Therapy   No orders of the defined types were placed in this encounter.   Discussed warning signs or symptoms. Please see  discharge instructions. Patient expresses understanding.   The above documentation has been reviewed and is accurate and complete Lynne Leader, M.D.

## 2019-09-01 NOTE — Patient Instructions (Addendum)
Thank you for coming in today. Plan for MRI.  Recheck after MRI.  Likely will proceed to PT.  I will place and order now because it takes a while.   Pay attention to how the pain responds to the numbing medicine.  Response to the shot is helpful for making a diagnosis.

## 2019-09-07 DIAGNOSIS — M7582 Other shoulder lesions, left shoulder: Secondary | ICD-10-CM | POA: Diagnosis not present

## 2019-09-11 ENCOUNTER — Ambulatory Visit (INDEPENDENT_AMBULATORY_CARE_PROVIDER_SITE_OTHER): Payer: BC Managed Care – PPO

## 2019-09-11 ENCOUNTER — Other Ambulatory Visit: Payer: Self-pay

## 2019-09-11 DIAGNOSIS — M25512 Pain in left shoulder: Secondary | ICD-10-CM | POA: Diagnosis not present

## 2019-09-11 DIAGNOSIS — M19012 Primary osteoarthritis, left shoulder: Secondary | ICD-10-CM | POA: Diagnosis not present

## 2019-09-11 DIAGNOSIS — R600 Localized edema: Secondary | ICD-10-CM | POA: Diagnosis not present

## 2019-09-13 ENCOUNTER — Ambulatory Visit: Payer: Self-pay

## 2019-09-13 ENCOUNTER — Other Ambulatory Visit: Payer: Self-pay

## 2019-09-13 ENCOUNTER — Ambulatory Visit: Payer: BC Managed Care – PPO | Admitting: Family Medicine

## 2019-09-13 ENCOUNTER — Encounter: Payer: Self-pay | Admitting: Family Medicine

## 2019-09-13 VITALS — BP 112/84 | HR 53 | Ht 67.0 in | Wt 277.2 lb

## 2019-09-13 DIAGNOSIS — G8929 Other chronic pain: Secondary | ICD-10-CM | POA: Diagnosis not present

## 2019-09-13 DIAGNOSIS — M25511 Pain in right shoulder: Secondary | ICD-10-CM

## 2019-09-13 DIAGNOSIS — M25512 Pain in left shoulder: Secondary | ICD-10-CM

## 2019-09-13 NOTE — Progress Notes (Signed)
MRI has evidence of adhesive capsulitis or frozen shoulder.  Mild rotator cuff tendinitis is also present.

## 2019-09-13 NOTE — Patient Instructions (Signed)
Thank you for coming in today. Call or go to the ER if you develop a large red swollen joint with extreme pain or oozing puss.  Plan for PT.  Recheck with me as needed.  Keep me updated.

## 2019-09-13 NOTE — Progress Notes (Signed)
Kayla Jacobson is a 44 y.o. female who presents to [place] at [facility] today for f/u of L shoulder pain and L shoulder MRI review and to discuss new R shoulder pain.  She was last seen by Dr. Georgina Snell on 09/01/19 w/ c/o L post-lat shoulder pain x 5 months and had a L GHJ injection and was referred for a L shoulder MRI.  Since her last visit, pt reports that her L shoulder has improved approximately 60% from her last visit.  She notes decreased L shoulder pain and slightly improved L shoulder ROM.  Additionally, her R shoulder is now hurting.  She reports chronic hx of R shoulder pain and some mechanical symptoms.  She notes increased pain w/ R shoulder overhead ROM and functional IR.  She locates her R shoulder pain to her R anterior shoulder.  Diagnostic imaging: L shoulder MRI- 09/11/19; L shoulder XR at [facility] in July 2021   Pertinent review of systems: No fevers or chills  Relevant historical information: PCOS, Chiari malformation.   Exam:  BP 112/84 (BP Location: Left Arm, Patient Position: Sitting, Cuff Size: Large)   Pulse (!) 53   Ht 5\' 7"  (1.702 m)   Wt 277 lb 3.2 oz (125.7 kg)   SpO2 96%   BMI 43.42 kg/m  General: Well Developed, well nourished, and in no acute distress.   MSK: Right shoulder normal-appearing normal motion pain with abduction.  Positive Hawkins and Neer's test.  Intact strength.  Left shoulder normal-appearing decreased motion abduction.  Intact strength.    Lab and Radiology Results No results found for this or any previous visit (from the past 72 hour(s)). MR SHOULDER LEFT WO CONTRAST  Result Date: 09/12/2019 CLINICAL DATA:  Left shoulder pain with pain on abduction for 6 months. No acute injury or prior relevant surgery. Partial relief from steroid injection 2 weeks ago. Adhesive capsulitis suspected. EXAM: MRI OF THE LEFT SHOULDER WITHOUT CONTRAST TECHNIQUE: Multiplanar, multisequence MR imaging of the shoulder was  performed. No intravenous contrast was administered. COMPARISON:  None. FINDINGS: Rotator cuff: There is mild insertional tendinosis of the supraspinatus tendon, best seen on the coronal images. No focal tendon tear. The subscapularis, infraspinatus and teres minor tendons appear normal. Muscles:  No focal muscular atrophy or edema. Biceps long head:  Intact and normally positioned. Acromioclavicular Joint: The acromion is type 2. There are minimal acromioclavicular degenerative changes and minimal fluid in the subacromial-subdeltoid bursa. Glenohumeral Joint: No significant joint effusion or glenohumeral arthropathy. There is mild joint capsular thickening and edema within the axillary recess and rotator interval as can be seen with adhesive capsulitis. Labrum: Labral assessment is limited by the lack of joint fluid. No evidence of labral tear or paralabral cyst. Bones: No acute or significant extra-articular osseous findings.Mild subcortical cyst formation posteriorly in the humeral head near the infraspinatus insertion. Other: No significant soft tissue findings. IMPRESSION: 1. Mild joint capsular thickening and edema within the axillary recess and rotator interval as can be seen with adhesive capsulitis. 2. Mild supraspinatus tendinosis. No evidence of rotator cuff tear. 3. The labrum and biceps tendon appear intact. Electronically Signed   By: Richardean Sale M.D.   On: 09/12/2019 07:30     EXAM: RIGHT SHOULDER - 2+ VIEW  COMPARISON:  None.  FINDINGS: There is no evidence of fracture or dislocation. There is no evidence of arthropathy or other focal bone abnormality. Soft tissues are unremarkable.  IMPRESSION: Negative.   Electronically Signed  By: Genia Del M.D.   On: 10/06/2016 14:28   I, Lynne Leader, personally (independently) visualized and performed the interpretation of the images attached in this note. Agree with radiology MRI read.  Mild use of capsulitis  present. Agree with radiology read regarding shoulder x-ray 2018.   Diagnostic Limited MSK Ultrasound of: Right shoulder Biceps tendon intact normal-appearing Subscapularis tendon is intact normal-appearing Supraspinatus tendon is thinned with increased subacromial bursa thickness.  Most likely representing subacromial bursitis.  Possible old partial-thickness tear without retraction. Infraspinatus tendon is intact. AC joint normal-appearing Impression: Probable subacromial bursitis   Procedure: Real-time Ultrasound Guided Injection of right subacromial bursa Device: Philips Affiniti 50G Images permanently stored and available for review in PACS Verbal informed consent obtained.  Discussed risks and benefits of procedure. Warned about infection bleeding damage to structures skin hypopigmentation and fat atrophy among others. Patient expresses understanding and agreement Time-out conducted.   Noted no overlying erythema, induration, or other signs of local infection.   Skin prepped in a sterile fashion.   Local anesthesia: Topical Ethyl chloride.   With sterile technique and under real time ultrasound guidance:  40 mg of Kenalog and 2 mL of Marcaine injected easily.   Completed without difficulty   Pain immediately resolved suggesting accurate placement of the medication.   Advised to call if fevers/chills, erythema, induration, drainage, or persistent bleeding.   Images permanently stored and available for review in the ultrasound unit.  Impression: Technically successful ultrasound guided injection.       Assessment and Plan: 44 y.o. female with right shoulder pain.  Etiology less clear but most likely rotator cuff tendinopathy/bursitis.  Plan for subacromial injection as above today along with physical therapy.  Left shoulder pain adhesive capsulitis based on MRI.  Patient had great response to glenohumeral injection with me a few weeks ago.  Physical therapy was ordered then  and scheduled to start in a few weeks.  This will be helpful for her left shoulder as well as right.  Recheck back with me as needed.   PDMP not reviewed this encounter. Orders Placed This Encounter  Procedures  . [place] LIMITED JOINT SPACE STRUCTURES UP BILAT(NO LINKED CHARGES)    Order Specific Question:   Reason for Exam (SYMPTOM  OR DIAGNOSIS REQUIRED)    Answer:   R shoulder pain    Order Specific Question:   Preferred imaging location?    Answer:   [place]  . Ambulatory referral to Physical Therapy    Referral Priority:   Routine    Referral Type:   Physical Medicine    Referral Reason:   Specialty Services Required    Requested Specialty:   Physical Therapy   No orders of the defined types were placed in this encounter.    Discussed warning signs or symptoms. Please see discharge instructions. Patient expresses understanding.   The above documentation has been reviewed and is accurate and complete Lynne Leader, M.D.  Total encounter time 30 minutes including face-to-face time with the patient and charting on the date of service.  Time excludes time to perform ultrasound and injection.

## 2019-09-22 ENCOUNTER — Other Ambulatory Visit: Payer: Self-pay | Admitting: Family Medicine

## 2019-09-22 DIAGNOSIS — F988 Other specified behavioral and emotional disorders with onset usually occurring in childhood and adolescence: Secondary | ICD-10-CM

## 2019-09-22 MED ORDER — LISDEXAMFETAMINE DIMESYLATE 70 MG PO CAPS: 70.0000 mg | ORAL_CAPSULE | Freq: Every day | ORAL | 0 refills | Status: DC

## 2019-09-22 NOTE — Telephone Encounter (Signed)
Last appt 07/21/2019 Chart supports Rx Last filled 08/15/2019

## 2019-10-18 ENCOUNTER — Other Ambulatory Visit: Payer: Self-pay | Admitting: Family Medicine

## 2019-10-18 ENCOUNTER — Ambulatory Visit: Payer: BC Managed Care – PPO | Admitting: Physical Therapy

## 2019-10-18 DIAGNOSIS — F419 Anxiety disorder, unspecified: Secondary | ICD-10-CM

## 2019-10-18 DIAGNOSIS — F988 Other specified behavioral and emotional disorders with onset usually occurring in childhood and adolescence: Secondary | ICD-10-CM

## 2019-10-18 NOTE — Telephone Encounter (Signed)
Patient is calling to check the status for medication refill for vyvanse, xanax and carisoprodol, please advise. CB is [phone].

## 2019-10-18 NOTE — Telephone Encounter (Signed)
Received a refill request for: Alprazolam 2 mg LR 07/21/19. #60, 2 rf's LOV 07/21/19 FOV  None scheduled, called lft VM to rtn call to schedule one.   Please review and advise.  Thanks.  Dm/cma

## 2019-10-18 NOTE — Telephone Encounter (Signed)
Hey Dr C,  spoke to patient VIA phone, she has scheduled a f/u on 12/21/19 VV. She is asking about the refills on Vyvanse, Xanax, and Carisoprodol.   Please review and advise.   Thanks. Dm/cma

## 2019-10-19 DIAGNOSIS — G8929 Other chronic pain: Secondary | ICD-10-CM

## 2019-10-19 MED ORDER — LISDEXAMFETAMINE DIMESYLATE 70 MG PO CAPS: 70.0000 mg | ORAL_CAPSULE | Freq: Every day | ORAL | 0 refills | Status: DC

## 2019-10-19 MED ORDER — CARISOPRODOL 350 MG PO TABS: 350.0000 mg | ORAL_TABLET | Freq: Four times a day (QID) | ORAL | 5 refills | Status: DC | PRN

## 2019-10-24 ENCOUNTER — Ambulatory Visit: Payer: BC Managed Care – PPO | Admitting: Physical Therapy

## 2019-10-28 ENCOUNTER — Other Ambulatory Visit: Payer: BC Managed Care – PPO

## 2019-10-28 ENCOUNTER — Other Ambulatory Visit: Payer: Self-pay

## 2019-10-28 DIAGNOSIS — R300 Dysuria: Secondary | ICD-10-CM

## 2019-10-28 NOTE — Addendum Note (Signed)
Addended by: Mliss Sax on: 10/28/2019 02:24 PM   Modules accepted: Orders

## 2019-10-31 ENCOUNTER — Other Ambulatory Visit: Payer: Self-pay | Admitting: Family Medicine

## 2019-10-31 LAB — URINE CULTURE
MICRO NUMBER:: 11107412
SPECIMEN QUALITY:: ADEQUATE

## 2019-10-31 MED ORDER — SULFAMETHOXAZOLE-TRIMETHOPRIM 800-160 MG PO TABS: 1.0000 | ORAL_TABLET | Freq: Two times a day (BID) | ORAL | 0 refills | Status: AC

## 2019-11-03 ENCOUNTER — Encounter: Payer: BC Managed Care – PPO | Admitting: Physical Therapy

## 2019-11-09 ENCOUNTER — Encounter: Payer: BC Managed Care – PPO | Admitting: Physical Therapy

## 2019-11-15 ENCOUNTER — Other Ambulatory Visit: Payer: Self-pay | Admitting: Family Medicine

## 2019-11-15 DIAGNOSIS — F988 Other specified behavioral and emotional disorders with onset usually occurring in childhood and adolescence: Secondary | ICD-10-CM

## 2019-11-15 NOTE — Telephone Encounter (Signed)
Hey Dr C,   Received a refill request for:  Vyvance 70 mg LR 10/19/19, #30, 0 rf LOV  07/21/19 FOV  12/21/19  Please review and advise.  Thanks.  Dm/cma

## 2019-11-16 MED ORDER — LISDEXAMFETAMINE DIMESYLATE 70 MG PO CAPS: 70.0000 mg | ORAL_CAPSULE | Freq: Every day | ORAL | 0 refills | Status: DC

## 2019-12-08 ENCOUNTER — Other Ambulatory Visit: Payer: Self-pay | Admitting: Family Medicine

## 2019-12-08 DIAGNOSIS — R053 Chronic cough: Secondary | ICD-10-CM

## 2019-12-09 ENCOUNTER — Other Ambulatory Visit: Payer: Self-pay | Admitting: Family Medicine

## 2019-12-09 DIAGNOSIS — F988 Other specified behavioral and emotional disorders with onset usually occurring in childhood and adolescence: Secondary | ICD-10-CM

## 2019-12-09 MED ORDER — LISDEXAMFETAMINE DIMESYLATE 70 MG PO CAPS: 70.0000 mg | ORAL_CAPSULE | Freq: Every day | ORAL | 0 refills | Status: DC

## 2019-12-09 NOTE — Telephone Encounter (Signed)
Refill request for: Vyvance 70 mg LR 11/16/19 LOV 07/21/19 FOV 12/21/19  Please review and advise.   Thanks.  Dm/cma

## 2019-12-21 ENCOUNTER — Encounter: Payer: Self-pay | Admitting: Family Medicine

## 2019-12-21 ENCOUNTER — Telehealth (INDEPENDENT_AMBULATORY_CARE_PROVIDER_SITE_OTHER): Payer: BC Managed Care – PPO | Admitting: Family Medicine

## 2019-12-21 VITALS — BP 128/72 | HR 78 | Temp 97.8°F | Ht 67.0 in | Wt 265.0 lb

## 2019-12-21 DIAGNOSIS — F988 Other specified behavioral and emotional disorders with onset usually occurring in childhood and adolescence: Secondary | ICD-10-CM

## 2019-12-21 DIAGNOSIS — F339 Major depressive disorder, recurrent, unspecified: Secondary | ICD-10-CM

## 2019-12-21 DIAGNOSIS — F419 Anxiety disorder, unspecified: Secondary | ICD-10-CM | POA: Diagnosis not present

## 2019-12-21 DIAGNOSIS — Z79899 Other long term (current) drug therapy: Secondary | ICD-10-CM

## 2019-12-21 MED ORDER — LISDEXAMFETAMINE DIMESYLATE 70 MG PO CAPS: 70.0000 mg | ORAL_CAPSULE | Freq: Every day | ORAL | 0 refills | Status: DC

## 2019-12-21 MED ORDER — ALPRAZOLAM 2 MG PO TABS: 2.0000 mg | ORAL_TABLET | Freq: Two times a day (BID) | ORAL | 2 refills | Status: DC

## 2019-12-21 NOTE — Progress Notes (Signed)
Virtual Visit via Video Note  I connected with Kayla Jacobson on 12/21/19 at 11:30 AM EST by a video enabled telemedicine application and verified that I am speaking with the correct person using two identifiers. Location patient: work Location provider: work  Persons participating in the virtual visit: patient, provider  I discussed the limitations of evaluation and management by telemedicine and the availability of in person appointments. The patient expressed understanding and agreed to proceed.  Chief Complaint  Patient presents with  . Follow-up    6 month  f/u meds.       HPI: Kayla Jacobson is a 44 y.o. female seen today for routine f/u on ADD, anxiety, depression.  For ADD pt is taking vyvanse 70mg  daily.  For her anxiety and depression, pt is taking zoloft 150mg  daily, vistaril 50mg  qHS, and xanax 2mg  BID.  Sleep is at baseline - wakes 2-3x/night.  Appetite - she is eating less as she is trying to lose weight.     Depression screen [facility] 2/9 12/21/2019 03/10/2019 05/19/2018  Decreased Interest 0 0 3  Down, Depressed, Hopeless 0 0 3  PHQ - 2 Score 0 0 6  Altered sleeping 0 - 2  Tired, decreased energy 0 - 2  Change in appetite 0 - 2  Feeling bad or failure about yourself  0 - 2  Trouble concentrating 0 - 1  Moving slowly or fidgety/restless 0 - 0  Suicidal thoughts 0 - 0  PHQ-9 Score 0 - 15  Difficult doing work/chores Not difficult at all - Very difficult   GAD 7 : Generalized Anxiety Score 05/19/2018  Nervous, Anxious, on Edge 3  Control/stop worrying 2  Worry too much - different things 3  Trouble relaxing 3  Restless 0  Easily annoyed or irritable 3  Afraid - awful might happen 3  Total GAD 7 Score 17    Past Medical History:  Diagnosis Date  . ADD (attention deficit disorder)   . Anxiety   . Arthritis   . Bipolar disorder ([place])   . Chiari malformation   . Depression    bipolar  . Dysrhythmia   . Fibromyalgia   . Headache    migraines  .  Seasonal asthma     Past Surgical History:  Procedure Laterality Date  . ABLATION    . CARPAL TUNNEL RELEASE     bilateral  . CHOLECYSTECTOMY    . HYSTEROSCOPY W/ ENDOMETRIAL ABLATION    . LUMBAR LAMINECTOMY/DECOMPRESSION MICRODISCECTOMY Left 11/21/2015   Procedure: CENTRAL LUMBAR DECOMPRESSION MICRODISCECTOMY L4-L5, MICRODISECTOMY L4-L5 LEFT; FORAMINOTOMY L4 AND L5 ROOT LEFT;  Surgeon: Latanya Maudlin, MD;  Location: [facility];  Service: Orthopedics;  Laterality: Left;  . TUBAL LIGATION    . WISDOM TOOTH EXTRACTION      Family History  Problem Relation Age of Onset  . Uterine cancer Mother   . Pulmonary embolism Mother   . Anxiety disorder Mother   . Depression Mother   . Hyperlipidemia Mother   . Breast cancer Other        grandmother  . Diabetes Father   . Bipolar disorder Sister   . Hypersomnolence Sister   . Chiari malformation Sister   . Chiari malformation Brother   . Lumbar disc disease Brother   . Polymyalgia rheumatica Maternal Aunt   . ADD / ADHD Son   . Breast cancer Maternal Grandmother   . Clotting disorder Maternal Grandfather   . Lupus Paternal Grandmother   . Heart  attack Paternal Grandfather     Social History   Tobacco Use  . Smoking status: Former Smoker    Types: E-cigarettes  . Smokeless tobacco: Never Used  Vaping Use  . Vaping Use: Former  Substance Use Topics  . Alcohol use: Yes    Comment: occasional-states 2 month  . Drug use: Never     Current Outpatient Medications:  .  albuterol (VENTOLIN HFA) 108 (90 Base) MCG/ACT inhaler, INHALE 2 PUFFS INTO THE LUNGS EVERY 4 HOURS AS NEEDED FOR WHEEZING OR SHORTNESS OF BREATH, Disp: 18 g, Rfl: 2 .  alprazolam (XANAX) 2 MG tablet, TAKE 1 TABLET(2 MG) BY MOUTH IN THE MORNING AND AT BEDTIME, Disp: 60 tablet, Rfl: 2 .  carisoprodol (SOMA) 350 MG tablet, Take 1 tablet (350 mg total) by mouth 4 (four) times daily as needed for muscle spasms., Disp: 120 tablet, Rfl: 5 .  Cholecalciferol 5000 units TABS,  Take 1 tablet daily by mouth., Disp: , Rfl:  .  EPINEPHrine 0.3 mg/0.3 mL IJ SOAJ injection, Inject 0.3 mLs (0.3 mg total) into the muscle as needed for anaphylaxis., Disp: 2 each, Rfl: 0 .  hydrOXYzine (VISTARIL) 50 MG capsule, Take 1 capsule (50 mg total) by mouth at bedtime., Disp: 90 capsule, Rfl: 5 .  lisdexamfetamine (VYVANSE) 70 MG capsule, Take 1 capsule (70 mg total) by mouth daily., Disp: 30 capsule, Rfl: 0 .  sertraline (ZOLOFT) 100 MG tablet, Take 1.5 tablets (150 mg total) by mouth daily., Disp: 135 tablet, Rfl: 3 .  levalbuterol (XOPENEX) 0.63 MG/3ML nebulizer solution, Take 3 mLs (0.63 mg total) by nebulization once for 1 dose., Disp: 3 mL, Rfl: 12  Allergies  Allergen Reactions  . Cymbalta [Duloxetine Hcl]     SI  . Influenza Vaccines Anaphylaxis and Swelling  . Other Anaphylaxis    WALNUTS---THROAT CLOSES UP  . Advair Diskus [Fluticasone-Salmeterol] Hives  . Gabapentin Other (See Comments)    it affected her motivation.   . Trazodone And Nefazodone     "made me nuts," "I wanted to pull my hair out."      ROS: See pertinent positives and negatives per HPI.   EXAM:  VITALS per patient if applicable: BP 793/90   Pulse 78   Temp 97.8 F (36.6 C) (Oral)   Ht 5\' 7"  (1.702 m)   Wt 265 lb (120.2 kg)   SpO2 98%   BMI 41.50 kg/m    BP Readings from Last 3 Encounters:  12/21/19 128/72  09/13/19 112/84  09/01/19 124/88   Pulse Readings from Last 3 Encounters:  12/21/19 78  09/13/19 (!) 53  09/01/19 70   Wt Readings from Last 3 Encounters:  12/21/19 265 lb (120.2 kg)  09/13/19 277 lb 3.2 oz (125.7 kg)  09/01/19 278 lb 6.4 oz (126.3 kg)     GENERAL: alert, oriented, appears well and in no acute distress  HEENT: atraumatic, conjunctiva clear, no obvious abnormalities on inspection of external nose and ears  NECK: normal movements of the head and neck  LUNGS: on inspection no signs of respiratory distress, breathing rate appears normal, no obvious gross  SOB, gasping or wheezing, no conversational dyspnea  CV: no obvious cyanosis  PSYCH/NEURO: pleasant and cooperative, no obvious depression or anxiety, speech and thought processing grossly intact   ASSESSMENT AND PLAN:  1. Attention deficit disorder, unspecified hyperactivity presence - controlled, stable - database reviewed and appropriate - DRUG MONITORING, PANEL 8 WITH CONFIRMATION, URINE; Future Refill: - lisdexamfetamine (VYVANSE) 70 MG  capsule; Take 1 capsule (70 mg total) by mouth daily.  Dispense: 30 capsule; Refill: 0 - f/u in 6 mo or sooner PRN  2. Anxiety disorder, unspecified type - stable, controlled - database reviewed and appropriate - DRUG MONITORING, PANEL 8 WITH CONFIRMATION, URINE; Future - cont zoloft 150mg  daily, vistaril 50mg  qHS Refill: - alprazolam (XANAX) 2 MG tablet; Take 1 tablet (2 mg total) by mouth in the morning and at bedtime.  Dispense: 60 tablet; Refill: 2 - f/u in 6 mo or sooner PRN  3. Depression, recurrent ([place]) - stable, controlled - PHQ-9 = 0 - cont zoloft 150mg  daily  4. High risk medications (not anticoagulants) long-term use - DRUG MONITORING, PANEL 8 WITH CONFIRMATION, URINE; Future   Pt is due for CPE, fasting labs. She will schedule CPE and ADD, anxiety f/u appt in 6 mo (in-person) so all can be done at that appt    I discussed the assessment and treatment plan with the patient. The patient was provided an opportunity to ask questions and all were answered. The patient agreed with the plan and demonstrated an understanding of the instructions.   The patient was advised to call back or seek an in-person evaluation if the symptoms worsen or if the condition fails to improve as anticipated.   Letta Median, DO

## 2020-02-01 ENCOUNTER — Encounter: Payer: Self-pay | Admitting: Family Medicine

## 2020-02-01 DIAGNOSIS — F988 Other specified behavioral and emotional disorders with onset usually occurring in childhood and adolescence: Secondary | ICD-10-CM

## 2020-02-02 ENCOUNTER — Encounter: Payer: Self-pay | Admitting: Family Medicine

## 2020-02-02 DIAGNOSIS — G8929 Other chronic pain: Secondary | ICD-10-CM

## 2020-02-02 DIAGNOSIS — F988 Other specified behavioral and emotional disorders with onset usually occurring in childhood and adolescence: Secondary | ICD-10-CM

## 2020-02-02 DIAGNOSIS — M546 Pain in thoracic spine: Secondary | ICD-10-CM

## 2020-02-02 MED ORDER — LISDEXAMFETAMINE DIMESYLATE 70 MG PO CAPS: 70.0000 mg | ORAL_CAPSULE | Freq: Every day | ORAL | 0 refills | Status: DC

## 2020-02-02 NOTE — Addendum Note (Signed)
Addended by: Ronnald Nian on: 02/02/2020 03:44 PM   Modules accepted: Orders

## 2020-02-02 NOTE — Telephone Encounter (Signed)
Please see message and advise.  Thank you. ° °

## 2020-02-03 ENCOUNTER — Other Ambulatory Visit: Payer: Self-pay

## 2020-02-03 ENCOUNTER — Ambulatory Visit (INDEPENDENT_AMBULATORY_CARE_PROVIDER_SITE_OTHER): Payer: BC Managed Care – PPO | Admitting: Family Medicine

## 2020-02-03 ENCOUNTER — Telehealth: Payer: Self-pay | Admitting: Family Medicine

## 2020-02-03 ENCOUNTER — Encounter: Payer: Self-pay | Admitting: Family Medicine

## 2020-02-03 VITALS — BP 126/82 | HR 89 | Temp 97.5°F | Ht 67.0 in | Wt 260.2 lb

## 2020-02-03 DIAGNOSIS — T8149XA Infection following a procedure, other surgical site, initial encounter: Secondary | ICD-10-CM

## 2020-02-03 MED ORDER — MUPIROCIN CALCIUM 2 % EX CREA
1.0000 | TOPICAL_CREAM | Freq: Every day | CUTANEOUS | 0 refills | Status: DC
Start: 2020-02-03 — End: 2020-02-10

## 2020-02-03 MED ORDER — TIZANIDINE HCL 4 MG PO TABS: 4.0000 mg | ORAL_TABLET | Freq: Three times a day (TID) | ORAL | 2 refills | Status: DC

## 2020-02-03 NOTE — Telephone Encounter (Signed)
Caller Name: pt, Selinda Eon Call back phone #: [phone]  MEDICATION(S): mupirocin cream (BACTROBAN) 2 %  This is $183 at the pharmacy.  Pt states that mupirocin ointment 2% 22gm tube is far cheaper when using [place] and asking for change to be sent in today please.

## 2020-02-03 NOTE — Progress Notes (Signed)
[facility] [address] [place] [address] Dept: [phone] Dept Fax: [phone]  Acute Office Visit  Subjective:    Patient ID: Kayla Jacobson, female    DOB: [birth year], 45 y.o..   MRN: [id number]   Chief Complaint  Patient presents with  . Wound Infection    Possible wound infection lower left abdomin very painful.     History of Present Illness:  Patient is in today with concern about a post-operative wound infection. Kayla Jacobson underwent a panniculectomy on 12/26/2020 in [place], [place]. She was placed on Keflex for 10 days postoperatively. After she finished this course, she began having redness and purulent drainage. She was placed on a 2nd course of Keflex by her surgeon, which she finished on the 20th. She continues to have concern for a site on the leftward end of her surgical wound. She notes this has been red. She describes being able to express purulent drainage and is worried about wound dehiscence. She also is worried about redness at other sites along her wound. She is finding that the discomfort makes it hard for her to work (she is a Kayla Jacobson).  Past Medical History: Patient Active Problem List   Diagnosis Date Noted  . History of 2019 novel coronavirus disease (COVID-19) 01/10/2019  . Pneumonia due to COVID-19 virus 01/10/2019  . Depression, recurrent ([place]) 05/19/2018  . Severe obesity (BMI >= 40) ([place]) 05/19/2018  . ADD (attention deficit disorder) 05/13/2017  . PTSD (post-traumatic stress disorder) 05/13/2017  . Anxiety disorder 05/13/2017  . Chiari malformation type I ([place]) 05/13/2017  . PCOS (polycystic ovarian syndrome) 05/13/2017   Past Surgical History:  Procedure Laterality Date  . ABLATION    . CARPAL TUNNEL RELEASE     bilateral  . CHOLECYSTECTOMY    . HYSTEROSCOPY W/ ENDOMETRIAL ABLATION    . LUMBAR LAMINECTOMY/DECOMPRESSION MICRODISCECTOMY Left  11/21/2015   Procedure: CENTRAL LUMBAR DECOMPRESSION MICRODISCECTOMY L4-L5, MICRODISECTOMY L4-L5 LEFT; FORAMINOTOMY L4 AND L5 ROOT LEFT;  Surgeon: Latanya Maudlin, MD;  Location: [facility];  Service: Orthopedics;  Laterality: Left;  . PANNICULECTOMY Bilateral 12/27/2019  . TUBAL LIGATION    . WISDOM TOOTH EXTRACTION     Outpatient Medications Prior to Visit  Medication Sig Dispense Refill  . albuterol (VENTOLIN HFA) 108 (90 Base) MCG/ACT inhaler INHALE 2 PUFFS INTO THE LUNGS EVERY 4 HOURS AS NEEDED FOR WHEEZING OR SHORTNESS OF BREATH 18 g 2  . alprazolam (XANAX) 2 MG tablet Take 1 tablet (2 mg total) by mouth in the morning and at bedtime. 60 tablet 2  . Cholecalciferol 5000 units TABS Take 1 tablet daily by mouth.    . EPINEPHrine 0.3 mg/0.3 mL IJ SOAJ injection Inject 0.3 mLs (0.3 mg total) into the muscle as needed for anaphylaxis. 2 each 0  . hydrOXYzine (VISTARIL) 50 MG capsule Take 1 capsule (50 mg total) by mouth at bedtime. 90 capsule 5  . levalbuterol (XOPENEX) 0.63 MG/3ML nebulizer solution Take 3 mLs (0.63 mg total) by nebulization once for 1 dose. 3 mL 12  . lisdexamfetamine (VYVANSE) 70 MG capsule Take 1 capsule (70 mg total) by mouth daily. 30 capsule 0  . sertraline (ZOLOFT) 100 MG tablet Take 1.5 tablets (150 mg total) by mouth daily. 135 tablet 3  . tiZANidine (ZANAFLEX) 4 MG tablet Take 1 tablet (4 mg total) by mouth 3 (three) times daily. 90 tablet 2   No facility-administered medications prior to visit.  Allergies  Allergen Reactions  . Cymbalta [Duloxetine Hcl]     SI  . Influenza Vaccines Anaphylaxis and Swelling  . Other Anaphylaxis    WALNUTS---THROAT CLOSES UP  . Advair Diskus [Fluticasone-Salmeterol] Hives  . Gabapentin Other (See Comments)    it affected her motivation.   . Trazodone And Nefazodone     "made me nuts," "I wanted to pull my hair out."     Objective:   Today's Vitals   02/03/20 1305  BP: 126/82  Pulse: 89  Temp: (!) 97.5 F (36.4 C)   TempSrc: Temporal  SpO2: 96%  Weight: 260 lb 3.2 oz (118 kg)  Height: 5\' 7"  (1.702 m)   Body mass index is 40.75 kg/m.   General: Well developed, well nourished. Obese. No acute distress. Abdomen: There is a long surgical wound across the lower abdomen. There is good wound closure across almost the entirety of the wound. There is some mild edema of the skin surrounding the wound and mild redness at sites that appear to be normal reaction to healing. Towards the left terminus of the wound, there is a 3-4 mm superficial area of ulceration with some whitish fibrinous exudate. I was able to remove this with use of plain gauze. The wound base appears clean. I was unable to express any purulent drainage at this point. Wound culture was obtained. Psych: Alert and oriented x3. Normal mood and affect.  Health Maintenance Due  Topic Date Due  . Hepatitis C Screening  Never done  . PAP SMEAR-Modifier  02/06/2020   Assessment & Plan:   1. Superficial postoperative wound infection The area of concern appears to be superficial. I do not see clear sign of active infection. A wound culture was obtained. I will prescribe Bactroban for her to place int he wound base and then cover with Tegaderm. She can change this dressing daily. I recommend follow-up in 1 week with her PCP.  - mupirocin cream (BACTROBAN) 2 %; Apply 1 application topically daily.  Dispense: 15 g; Refill: 0 - Wound culture  Haydee Salter, MD

## 2020-02-03 NOTE — Telephone Encounter (Signed)
Spoke with patient who states that she will try the OTC ointment.

## 2020-02-03 NOTE — Telephone Encounter (Signed)
Please advise message below  °

## 2020-02-06 LAB — WOUND CULTURE
MICRO NUMBER:: 11470150
SPECIMEN QUALITY:: ADEQUATE

## 2020-02-07 ENCOUNTER — Encounter: Payer: Self-pay | Admitting: Family Medicine

## 2020-02-07 ENCOUNTER — Other Ambulatory Visit: Payer: Self-pay

## 2020-02-07 ENCOUNTER — Ambulatory Visit (INDEPENDENT_AMBULATORY_CARE_PROVIDER_SITE_OTHER): Payer: BC Managed Care – PPO | Admitting: Family Medicine

## 2020-02-07 VITALS — BP 134/80 | HR 98 | Temp 98.2°F | Resp 16 | Ht 67.0 in | Wt 260.1 lb

## 2020-02-07 DIAGNOSIS — R110 Nausea: Secondary | ICD-10-CM | POA: Diagnosis not present

## 2020-02-07 DIAGNOSIS — R197 Diarrhea, unspecified: Secondary | ICD-10-CM

## 2020-02-07 DIAGNOSIS — L03311 Cellulitis of abdominal wall: Secondary | ICD-10-CM | POA: Diagnosis not present

## 2020-02-07 LAB — CBC WITH DIFFERENTIAL/PLATELET
Basophils Absolute: 0 10*3/uL (ref 0.0–0.1)
Basophils Relative: 0.5 % (ref 0.0–3.0)
Eosinophils Absolute: 0.1 10*3/uL (ref 0.0–0.7)
Eosinophils Relative: 1.9 % (ref 0.0–5.0)
HCT: 37.7 % (ref 36.0–46.0)
Hemoglobin: 12.7 g/dL (ref 12.0–15.0)
Lymphocytes Relative: 16 % (ref 12.0–46.0)
Lymphs Abs: 1 10*3/uL (ref 0.7–4.0)
MCHC: 33.8 g/dL (ref 30.0–36.0)
MCV: 94.5 fl (ref 78.0–100.0)
Monocytes Absolute: 0.3 10*3/uL (ref 0.1–1.0)
Monocytes Relative: 4.5 % (ref 3.0–12.0)
Neutro Abs: 4.6 10*3/uL (ref 1.4–7.7)
Neutrophils Relative %: 77.1 % — ABNORMAL HIGH (ref 43.0–77.0)
Platelets: 283 10*3/uL (ref 150.0–400.0)
RBC: 3.99 Mil/uL (ref 3.87–5.11)
RDW: 13.7 % (ref 11.5–15.5)
WBC: 5.9 10*3/uL (ref 4.0–10.5)

## 2020-02-07 LAB — BASIC METABOLIC PANEL
BUN: 10 mg/dL (ref 6–23)
CO2: 29 mEq/L (ref 19–32)
Calcium: 9.5 mg/dL (ref 8.4–10.5)
Chloride: 102 mEq/L (ref 96–112)
Creatinine, Ser: 0.63 mg/dL (ref 0.40–1.20)
GFR: 107.78 mL/min (ref >60.00)
Glucose, Bld: 88 mg/dL (ref 70–99)
Potassium: 4.3 mEq/L (ref 3.5–5.1)
Sodium: 137 mEq/L (ref 135–145)

## 2020-02-07 MED ORDER — HYDROCODONE-ACETAMINOPHEN 5-325 MG PO TABS: 1.0000 | ORAL_TABLET | Freq: Three times a day (TID) | ORAL | 0 refills | Status: DC | PRN

## 2020-02-07 MED ORDER — CEFTRIAXONE SODIUM 1 G IJ SOLR
0.5000 g | Freq: Once | INTRAMUSCULAR | Status: AC
  Administered 2020-02-07: 0.5 g via INTRAMUSCULAR

## 2020-02-07 MED ORDER — SULFAMETHOXAZOLE-TRIMETHOPRIM 800-160 MG PO TABS: 1.0000 | ORAL_TABLET | Freq: Two times a day (BID) | ORAL | 0 refills | Status: DC

## 2020-02-07 MED ORDER — ONDANSETRON HCL 4 MG PO TABS: 4.0000 mg | ORAL_TABLET | Freq: Three times a day (TID) | ORAL | 0 refills | Status: AC | PRN

## 2020-02-07 NOTE — Progress Notes (Signed)
ACUTE VISIT: Chief Complaint  Patient presents with  . Post-op Problem    Had 2 rounds of Keflex on 12/27/19 and 01/10/20. Seen on 1/28, wound cx done and prescribed mupirocin cream. Wasn't covered by insurance, provider wouldn't change to ointment which was covered. Pt is using OTC cream and Tegaderm patches. Wound cx positive for staphylococcus Aureus. Started with nausea/headache this morning as well as diarrhea.    HPI: Kayla Jacobson is a 45 y.o. female with hx of PSTD,ADD, and fibromyalgia with above concern. S/P panniculectomy on 12/27/19. Left side of wound is erythematous and she has noted small amount of purulent drainage. She is applying topical abx cream. Pain is severe, not radiated. Exacerbated by movement and alleviated by rest.  Component 4 d ago  MICRO NUMBER: [id number]   SPECIMEN QUALITY: Adequate   SOURCE: LOWER LEFT ABDOMEN   STATUS: FINAL   GRAM STAIN: No white blood cells seen Few epithelial cells No organisms seen   ISOLATE 1: Staphylococcus aureusAbnormal   Comment: Heavy growth of Staphylococcus aureus   Today she has had diarrhea x 3. She has not noted fever but has had chills. There is no generalized abdominal pain.  She has not noted blood in stool or melena. + Nausea, no vomiting. Denies dysuria,increased urinary frequency, gross hematuria,or decreased urine output.  She took Ibuprofen 800 mg today.  Review of Systems  Constitutional: Positive for activity change, appetite change and fatigue.  HENT: Negative for mouth sores and sore throat.   Respiratory: Negative for cough, shortness of breath and wheezing.   Cardiovascular: Negative for chest pain and palpitations.  Musculoskeletal: Positive for myalgias. Negative for gait problem.  Skin: Positive for wound.  Neurological: Negative for syncope and weakness.  Rest see pertinent positives and negatives per HPI.  Current Outpatient Medications on File Prior to Visit  Medication Sig  Dispense Refill  . albuterol (VENTOLIN HFA) 108 (90 Base) MCG/ACT inhaler INHALE 2 PUFFS INTO THE LUNGS EVERY 4 HOURS AS NEEDED FOR WHEEZING OR SHORTNESS OF BREATH 18 g 2  . alprazolam (XANAX) 2 MG tablet Take 1 tablet (2 mg total) by mouth in the morning and at bedtime. 60 tablet 2  . Cholecalciferol 5000 units TABS Take 1 tablet daily by mouth.    . EPINEPHrine 0.3 mg/0.3 mL IJ SOAJ injection Inject 0.3 mLs (0.3 mg total) into the muscle as needed for anaphylaxis. 2 each 0  . hydrOXYzine (VISTARIL) 50 MG capsule Take 1 capsule (50 mg total) by mouth at bedtime. 90 capsule 5  . lisdexamfetamine (VYVANSE) 70 MG capsule Take 1 capsule (70 mg total) by mouth daily. 30 capsule 0  . mupirocin cream (BACTROBAN) 2 % Apply 1 application topically daily. 15 g 0  . sertraline (ZOLOFT) 100 MG tablet Take 1.5 tablets (150 mg total) by mouth daily. 135 tablet 3  . tiZANidine (ZANAFLEX) 4 MG tablet Take 1 tablet (4 mg total) by mouth 3 (three) times daily. 90 tablet 2  . levalbuterol (XOPENEX) 0.63 MG/3ML nebulizer solution Take 3 mLs (0.63 mg total) by nebulization once for 1 dose. 3 mL 12   No current facility-administered medications on file prior to visit.     Past Medical History:  Diagnosis Date  . ADD (attention deficit disorder)   . Anxiety   . Arthritis   . Bipolar disorder ([place])   . Chiari malformation   . Depression    bipolar  . Dysrhythmia   . Fibromyalgia   . Headache  migraines  . Seasonal asthma    Allergies  Allergen Reactions  . Cymbalta [Duloxetine Hcl]     SI  . Influenza Vaccines Anaphylaxis and Swelling  . Other Anaphylaxis    WALNUTS---THROAT CLOSES UP  . Advair Diskus [Fluticasone-Salmeterol] Hives  . Gabapentin Other (See Comments)    it affected her motivation.   . Trazodone And Nefazodone     "made me nuts," "I wanted to pull my hair out."    Social History   Socioeconomic History  . Marital status: Married    Spouse name: Not on file  . Number of  children: Not on file  . Years of education: Not on file  . Highest education level: Not on file  Occupational History  . Not on file  Tobacco Use  . Smoking status: Former Smoker    Types: E-cigarettes  . Smokeless tobacco: Never Used  Vaping Use  . Vaping Use: Former  Substance and Sexual Activity  . Alcohol use: Yes    Comment: occasional-states 2 month  . Drug use: Never  . Sexual activity: Not on file  Other Topics Concern  . Not on file  Social History Narrative  . Not on file   Social Determinants of Health   Financial Resource Strain: Not on file  Food Insecurity: Not on file  Transportation Needs: Not on file  Physical Activity: Not on file  Stress: Not on file  Social Connections: Not on file    Vitals:   02/07/20 1139  BP: 134/80  Pulse: 98  Resp: 16  Temp: 98.2 F (36.8 C)  SpO2: 96%   Body mass index is 40.74 kg/m.  Physical Exam Vitals and nursing note reviewed.  Constitutional:      General: She is in acute distress.     Appearance: She is not ill-appearing.  HENT:     Head: Normocephalic and atraumatic.  Eyes:     Conjunctiva/sclera: Conjunctivae normal.  Cardiovascular:     Rate and Rhythm: Normal rate and regular rhythm.     Pulses: Normal pulses.     Heart sounds: Normal heart sounds. No murmur heard.   Pulmonary:     Effort: Pulmonary effort is normal. No respiratory distress.     Breath sounds: Normal breath sounds.  Abdominal:     Palpations: Abdomen is soft. There is no mass.     Tenderness: There is abdominal tenderness. There is no guarding or rebound.  Musculoskeletal:     Right lower leg: No edema.     Left lower leg: No edema.  Skin:         Comments: Most of lower abdominal wound is healing well, left side of wound is open. No active drainage upon pressing area. Has some yellowish matter (ab cream). Erythema,local heat,and induration. No fluctuant area. See picture.  Neurological:     General: No focal deficit  present.     Mental Status: She is alert and oriented to person, place, and time.     Comments: Antalgic gait.  Psychiatric:        Mood and Affect: Mood is anxious.         ASSESSMENT AND PLAN:  Kayla Jacobson was seen today for post-op problem.  Diagnoses and all orders for this visit: Orders Placed This Encounter  Procedures  . CBC with Differential/Platelet  . Basic metabolic panel   Lab Results  Component Value Date   WBC 5.9 02/07/2020   HGB 12.7 02/07/2020   HCT  37.7 02/07/2020   MCV 94.5 02/07/2020   PLT 283.0 02/07/2020   Lab Results  Component Value Date   CREATININE 0.63 02/07/2020   BUN 10 02/07/2020   NA 137 02/07/2020   K 4.3 02/07/2020   CL 102 02/07/2020   CO2 29 02/07/2020    Cellulitis, abdominal wall Here in the office and after verbal consent she received Rocephin 500 mg IM, instructed to stay for observation 20-30 min. Continue with Bactrim DS bid x 7 days. Keep wound clean with soap and water. Hydrocodone-Acetaminophen for pain management. Side effects of medications discussed. Instructed about warning signs.  -     sulfamethoxazole-trimethoprim (BACTRIM DS) 800-160 MG tablet; Take 1 tablet by mouth 2 (two) times daily. -     cefTRIAXone (ROCEPHIN) injection 0.5 g -     CBC with Differential/Platelet -     HYDROcodone-acetaminophen (NORCO/VICODIN) 5-325 MG tablet; Take 1 tablet by mouth every 8 (eight) hours as needed for up to 3 days for moderate pain.  Diarrhea, unspecified type We discussed possible etiologies. For now I recommend adequate hydration. OTC Probiotic may help.  Side effects of abx's discussed. If problem continues C. Diff needs to be considered.  Nausea without vomiting Mild. Monitor for new symptoms. Zofran 4 mg tid prn. Adequate hydration.  -     ondansetron (ZOFRAN) 4 MG tablet; Take 1 tablet (4 mg total) by mouth every 8 (eight) hours as needed for up to 2 days for nausea or vomiting.  Spent 40 minutes.   During this time history was obtained and documented, examination was performed, prior labs reviewed, and assessment/plan discussed.  Return in about 3 days (around 02/10/2020) for Cellulitis with PCP.   Laelani Vasko G. [place], MD  [facility]. [place] office.   A few things to remember from today's visit:   Cellulitis, abdominal wall - Plan: CBC with Differential/Platelet, sulfamethoxazole-trimethoprim (BACTRIM DS) 800-160 MG tablet  Diarrhea, unspecified type - Plan: Basic metabolic panel  If you need refills please call your pharmacy. Do not use My Chart to request refills or for acute issues that need immediate attention.   Today Rocephin 500 mg Im given here in the office. Continue with bactrim 2 times daily. Keep wound clean with soap and water.  Probiotic may help with diarrhea, Align 1 cap daily. If diarrhea is persistent ,we will have to evaluate for C diff.   Please be sure medication list is accurate. If a new problem present, please set up appointment sooner than planned today.

## 2020-02-07 NOTE — Patient Instructions (Signed)
A few things to remember from today's visit:   Cellulitis, abdominal wall - Plan: CBC with Differential/Platelet, sulfamethoxazole-trimethoprim (BACTRIM DS) 800-160 MG tablet  Diarrhea, unspecified type - Plan: Basic metabolic panel  If you need refills please call your pharmacy. Do not use My Chart to request refills or for acute issues that need immediate attention.   Today Rocephin 500 mg Im given here in the office. Continue with bactrim 2 times daily. Keep wound clean with soap and water.  Probiotic may help with diarrhea, Align 1 cap daily. If diarrhea is persistent ,we will have to evaluate for C diff.   Please be sure medication list is accurate. If a new problem present, please set up appointment sooner than planned today.

## 2020-02-08 MED ORDER — AMPHETAMINE-DEXTROAMPHETAMINE 20 MG PO TABS: 20.0000 mg | ORAL_TABLET | Freq: Two times a day (BID) | ORAL | 0 refills | Status: DC

## 2020-02-10 ENCOUNTER — Ambulatory Visit: Payer: BC Managed Care – PPO | Admitting: Family Medicine

## 2020-02-10 ENCOUNTER — Other Ambulatory Visit: Payer: Self-pay

## 2020-02-10 ENCOUNTER — Encounter: Payer: Self-pay | Admitting: Family Medicine

## 2020-02-10 VITALS — BP 124/78 | HR 87 | Temp 98.1°F | Ht 67.0 in | Wt 260.6 lb

## 2020-02-10 DIAGNOSIS — L03311 Cellulitis of abdominal wall: Secondary | ICD-10-CM | POA: Diagnosis not present

## 2020-02-10 DIAGNOSIS — T8149XA Infection following a procedure, other surgical site, initial encounter: Secondary | ICD-10-CM | POA: Diagnosis not present

## 2020-02-10 NOTE — Progress Notes (Signed)
Kayla Jacobson is a 45 y.o. female  Chief Complaint  Patient presents with  . Follow-up    1 week f/u.     HPI: Kayla Jacobson is a 45 y.o. female seen 1 week ago by my colleague Dr. Gena Fray for concern about wound infection. 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 At OV 1 wk ago, pt was recommended to use topical abx cream BID but insurance would not cover so pt used neosporin. Wound culture was done at that OV: Component     Latest Ref Rng & Units 02/03/2020  MICRO NUMBER:      [id number]  SPECIMEN QUALITY:      Adequate  Source      LOWER LEFT ABDOMEN  STATUS:      FINAL  Gram Stain      No white blood cells seen Few epithelial cells No organisms seen  iSOLATE 1:      Staphylococcus aureus (A)   Pt was seen on 02/07/19 by Dr. Martinique. She was given 1gm rocephin IM and started on bactrim BID, which she is taking. Pain, redness, drainage have improved. Overall improving. Pt is requesting note for work some work accommodations as she continues to heal from this surgery and infection.   Past Medical History:  Diagnosis Date  . ADD (attention deficit disorder)   . Anxiety   . Arthritis   . Bipolar disorder ([place])   . Chiari malformation   . Depression    bipolar  . Dysrhythmia   . Fibromyalgia   . Headache    migraines  . Seasonal asthma     Past Surgical History:  Procedure Laterality Date  . ABLATION    . CARPAL TUNNEL RELEASE     bilateral  . CHOLECYSTECTOMY    . HYSTEROSCOPY W/ ENDOMETRIAL ABLATION    . LUMBAR LAMINECTOMY/DECOMPRESSION MICRODISCECTOMY Left 11/21/2015   Procedure: CENTRAL LUMBAR DECOMPRESSION MICRODISCECTOMY L4-L5, MICRODISECTOMY L4-L5 LEFT; FORAMINOTOMY L4 AND L5 ROOT LEFT;  Surgeon: Latanya Maudlin, MD;  Location: [facility];  Service: Orthopedics;  Laterality: Left;  . PANNICULECTOMY Bilateral 12/27/2019  . TUBAL LIGATION    . WISDOM TOOTH EXTRACTION      Social History   Socioeconomic History  . Marital  status: Married    Spouse name: Not on file  . Number of children: Not on file  . Years of education: Not on file  . Highest education level: Not on file  Occupational History  . Not on file  Tobacco Use  . Smoking status: Former Smoker    Types: E-cigarettes  . Smokeless tobacco: Never Used  Vaping Use  . Vaping Use: Former  Substance and Sexual Activity  . Alcohol use: Yes    Comment: occasional-states 2 month  . Drug use: Never  . Sexual activity: Not on file  Other Topics Concern  . Not on file  Social History Narrative  . Not on file   Social Determinants of Health   Financial Resource Strain: Not on file  Food Insecurity: Not on file  Transportation Needs: Not on file  Physical Activity: Not on file  Stress: Not on file  Social Connections: Not on file  Intimate Partner Violence: Not on file    Family History  Problem Relation Age of Onset  . Uterine cancer Mother   . Pulmonary embolism Mother   . Anxiety disorder Mother   . Depression Mother   . Hyperlipidemia Mother   . Breast cancer Other  grandmother  . Diabetes Father   . Bipolar disorder Sister   . Hypersomnolence Sister   . Chiari malformation Sister   . Chiari malformation Brother   . Lumbar disc disease Brother   . Polymyalgia rheumatica Maternal Aunt   . ADD / ADHD Son   . Breast cancer Maternal Grandmother   . Clotting disorder Maternal Grandfather   . Lupus Paternal Grandmother   . Heart attack Paternal Grandfather      There is no immunization history for the selected administration types on file for this patient.  Outpatient Encounter Medications as of 02/10/2020  Medication Sig  . albuterol (VENTOLIN HFA) 108 (90 Base) MCG/ACT inhaler INHALE 2 PUFFS INTO THE LUNGS EVERY 4 HOURS AS NEEDED FOR WHEEZING OR SHORTNESS OF BREATH  . alprazolam (XANAX) 2 MG tablet Take 1 tablet (2 mg total) by mouth in the morning and at bedtime.  Marland Kitchen amphetamine-dextroamphetamine (ADDERALL) 20 MG tablet  Take 1 tablet (20 mg total) by mouth 2 (two) times daily.  . Cholecalciferol 5000 units TABS Take 1 tablet daily by mouth.  . EPINEPHrine 0.3 mg/0.3 mL IJ SOAJ injection Inject 0.3 mLs (0.3 mg total) into the muscle as needed for anaphylaxis.  . hydrOXYzine (VISTARIL) 50 MG capsule Take 1 capsule (50 mg total) by mouth at bedtime.  . levalbuterol (XOPENEX) 0.63 MG/3ML nebulizer solution Take 3 mLs (0.63 mg total) by nebulization once for 1 dose.  . sertraline (ZOLOFT) 100 MG tablet Take 1.5 tablets (150 mg total) by mouth daily.  Marland Kitchen sulfamethoxazole-trimethoprim (BACTRIM DS) 800-160 MG tablet Take 1 tablet by mouth 2 (two) times daily.  Marland Kitchen tiZANidine (ZANAFLEX) 4 MG tablet Take 1 tablet (4 mg total) by mouth 3 (three) times daily.  . [DISCONTINUED] HYDROcodone-acetaminophen (NORCO/VICODIN) 5-325 MG tablet Take 1 tablet by mouth every 8 (eight) hours as needed for up to 3 days for moderate pain.  . [DISCONTINUED] mupirocin cream (BACTROBAN) 2 % Apply 1 application topically daily.   No facility-administered encounter medications on file as of 02/10/2020.     ROS: Pertinent positives and negatives noted in HPI. Remainder of ROS non-contributory    Allergies  Allergen Reactions  . Cymbalta [Duloxetine Hcl]     SI  . Influenza Vaccines Anaphylaxis and Swelling  . Other Anaphylaxis    WALNUTS---THROAT CLOSES UP  . Advair Diskus [Fluticasone-Salmeterol] Hives  . Gabapentin Other (See Comments)    it affected her motivation.   . Trazodone And Nefazodone     "made me nuts," "I wanted to pull my hair out."    BP 124/78   Pulse 87   Temp 98.1 F (36.7 C) (Temporal)   Ht 5\' 7"  (1.702 m)   Wt 260 lb 9.6 oz (118.2 kg)   SpO2 98%   BMI 40.82 kg/m   Physical Exam Constitutional:      General: She is not in acute distress.    Appearance: She is obese. She is not ill-appearing.  Skin:    Comments: Long horizontal surgical wound across her lower abdomen. Lt lateral aspect with scant serous  drainage, mild surrounding edema, no erythema, no fluctuance.    Neurological:     Mental Status: She is alert and oriented to person, place, and time.  Psychiatric:        Behavior: Behavior normal.      A/P:  1. Cellulitis, abdominal wall 2. Surgical wound infection - symptoms improving after rocephin 1g IM x 1 on 02/07/20 and bactrim BID x 7 days -  cont with local wound care - complete full course of abx - work note given and pt states FMLA/work accommodation form may be coming from [employer]. I will complete these if/when I receive them  - f/u if symptoms worsen at any point or do not continue to improve    This visit occurred during the SARS-CoV-2 public health emergency.  Safety protocols were in place, including screening questions prior to the visit, additional usage of staff PPE, and extensive cleaning of exam room while observing appropriate contact time as indicated for disinfecting solutions.

## 2020-02-15 ENCOUNTER — Telehealth: Payer: Self-pay

## 2020-02-15 NOTE — Telephone Encounter (Signed)
Spoke to patient and she states she will continue the Adderall for now but causing her HA's.  Hopefully this will get better.  Also the Tizanidine is causing some diarrhea. She will continue to take it in the evening and see if it gets better.  She will reach back out to [place] in 2-3 weeks to let us know how she is doing.   Dm/cma

## 2020-02-15 NOTE — Telephone Encounter (Signed)
Last message from pt was that vyvanse was cost-prohibitive and she wanted to try adderall 20mg  BID which was sent to pharmacy on 02/08/2020. Is this no longer what she wants to do?

## 2020-02-15 NOTE — Telephone Encounter (Signed)
PA for Vyvance 700 mg was completed and received response form [employer] that PA was not required at this time.  Patient states that the medication is too expensive and thinks if provider will do a tier exception that maybe she can get it at a lower cost.    Patient reports taking Ritalin (LA & IM), Concerta, and Adderall (XR, IM) with no help.   Please advise.   Thanks. Dm/cma

## 2020-02-23 ENCOUNTER — Encounter: Payer: Self-pay | Admitting: Family Medicine

## 2020-03-01 NOTE — Telephone Encounter (Signed)
Form faxed to [employer] @ [phone]. Dm/cma

## 2020-03-02 ENCOUNTER — Telehealth: Payer: Self-pay

## 2020-03-02 NOTE — Telephone Encounter (Signed)
Corrected paperwork faxed to [employer] @ [phone]. patient notified My chart message. Dm/cma

## 2020-03-02 NOTE — Telephone Encounter (Signed)
Pt messaged me wanting to know if we received a fax this morning, 03/02/20, concerning something going to [employer]. She said if its not addressed today to go into effect by Monday.  Please advise. Thanks!

## 2020-03-02 NOTE — Telephone Encounter (Signed)
Copy of both [employer] forms [phone]. Dm/cma

## 2020-03-02 NOTE — Telephone Encounter (Signed)
Paper on providers desk.  Dm/cma

## 2020-03-05 ENCOUNTER — Other Ambulatory Visit: Payer: Self-pay

## 2020-03-05 ENCOUNTER — Other Ambulatory Visit: Payer: Self-pay | Admitting: Family Medicine

## 2020-03-05 DIAGNOSIS — F988 Other specified behavioral and emotional disorders with onset usually occurring in childhood and adolescence: Secondary | ICD-10-CM

## 2020-03-07 MED ORDER — AMPHETAMINE-DEXTROAMPHETAMINE 20 MG PO TABS: 20.0000 mg | ORAL_TABLET | Freq: Two times a day (BID) | ORAL | 0 refills | Status: DC

## 2020-03-07 NOTE — Telephone Encounter (Signed)
30 day refill sent. Pt needs appt and UDS, controlled sub agreement prior to next refill

## 2020-03-13 NOTE — Telephone Encounter (Signed)
Called patient, no answer, no voicemail to leave message. Need to schedule an appointment before next refill is due. Will try to call back later.

## 2020-04-02 ENCOUNTER — Other Ambulatory Visit: Payer: Self-pay | Admitting: Family Medicine

## 2020-04-02 ENCOUNTER — Encounter: Payer: Self-pay | Admitting: Family Medicine

## 2020-04-02 DIAGNOSIS — G8929 Other chronic pain: Secondary | ICD-10-CM

## 2020-04-02 DIAGNOSIS — F419 Anxiety disorder, unspecified: Secondary | ICD-10-CM

## 2020-04-02 DIAGNOSIS — F988 Other specified behavioral and emotional disorders with onset usually occurring in childhood and adolescence: Secondary | ICD-10-CM

## 2020-04-02 NOTE — Telephone Encounter (Signed)
Last OV 02/10/20 Last fill 02/03/20  #90/2

## 2020-04-02 NOTE — Telephone Encounter (Signed)
Last OV 02/10/20 Last fill 12/21/19  #60/2

## 2020-04-02 NOTE — Telephone Encounter (Signed)
Please see message and advise.  Thank you. ° °

## 2020-04-03 MED ORDER — AMPHETAMINE-DEXTROAMPHETAMINE 20 MG PO TABS: 20.0000 mg | ORAL_TABLET | Freq: Two times a day (BID) | ORAL | 0 refills | Status: DC

## 2020-04-03 NOTE — Addendum Note (Signed)
Addended by: Ronnald Nian on: 04/03/2020 11:37 AM   Modules accepted: Orders

## 2020-04-30 ENCOUNTER — Encounter: Payer: Self-pay | Admitting: Family Medicine

## 2020-04-30 DIAGNOSIS — F988 Other specified behavioral and emotional disorders with onset usually occurring in childhood and adolescence: Secondary | ICD-10-CM

## 2020-04-30 DIAGNOSIS — Z91018 Allergy to other foods: Secondary | ICD-10-CM

## 2020-05-02 MED ORDER — EPINEPHRINE 0.3 MG/0.3ML IJ SOAJ: 0.3000 mg | INTRAMUSCULAR | 0 refills | Status: DC | PRN

## 2020-05-02 MED ORDER — AMPHETAMINE-DEXTROAMPHETAMINE 20 MG PO TABS: 20.0000 mg | ORAL_TABLET | Freq: Every day | ORAL | 0 refills | Status: DC

## 2020-05-02 MED ORDER — AMPHETAMINE-DEXTROAMPHETAMINE 30 MG PO TABS: 30.0000 mg | ORAL_TABLET | Freq: Every day | ORAL | 0 refills | Status: DC

## 2020-05-18 ENCOUNTER — Telehealth: Payer: Self-pay

## 2020-05-18 NOTE — Telephone Encounter (Signed)
Pt requesting a copy of her FMLA paperwork. She would like it mailed to the address on file.  Thank you

## 2020-05-18 NOTE — Telephone Encounter (Signed)
Paperwork mailed out today

## 2020-06-07 ENCOUNTER — Encounter: Payer: Self-pay | Admitting: Family Medicine

## 2020-06-07 DIAGNOSIS — F988 Other specified behavioral and emotional disorders with onset usually occurring in childhood and adolescence: Secondary | ICD-10-CM

## 2020-06-08 MED ORDER — AMPHETAMINE-DEXTROAMPHETAMINE 30 MG PO TABS
30.0000 mg | ORAL_TABLET | Freq: Two times a day (BID) | ORAL | 0 refills | Status: DC
Start: 2020-06-08 — End: 2020-07-25

## 2020-07-05 ENCOUNTER — Encounter: Payer: Self-pay | Admitting: Family Medicine

## 2020-07-05 DIAGNOSIS — F988 Other specified behavioral and emotional disorders with onset usually occurring in childhood and adolescence: Secondary | ICD-10-CM

## 2020-07-05 DIAGNOSIS — Z79899 Other long term (current) drug therapy: Secondary | ICD-10-CM

## 2020-07-10 ENCOUNTER — Other Ambulatory Visit: Payer: Self-pay

## 2020-07-10 ENCOUNTER — Other Ambulatory Visit: Payer: BC Managed Care – PPO

## 2020-07-10 ENCOUNTER — Encounter: Payer: Self-pay | Admitting: Family Medicine

## 2020-07-10 DIAGNOSIS — Z79899 Other long term (current) drug therapy: Secondary | ICD-10-CM

## 2020-07-10 DIAGNOSIS — F988 Other specified behavioral and emotional disorders with onset usually occurring in childhood and adolescence: Secondary | ICD-10-CM

## 2020-07-10 NOTE — Progress Notes (Signed)
Per orders of Dr. Bryan Lemma pt is here to provide a urine sample. Pt was able to provide adequate amount.

## 2020-07-11 ENCOUNTER — Telehealth: Payer: Self-pay | Admitting: Family Medicine

## 2020-07-11 ENCOUNTER — Other Ambulatory Visit: Payer: Self-pay | Admitting: Family Medicine

## 2020-07-11 DIAGNOSIS — F419 Anxiety disorder, unspecified: Secondary | ICD-10-CM

## 2020-07-11 LAB — "PAIN MGT SCRN (14 DRUGS), UR                    "
Amphetamine Scrn, Ur: POSITIVE ng/mL — AB
BARBITURATE SCREEN URINE: NEGATIVE ng/mL
BENZODIAZEPINE SCREEN, URINE: POSITIVE ng/mL — AB
Buprenorphine, Urine: NEGATIVE ng/mL
CANNABINOIDS UR QL SCN: NEGATIVE ng/mL
Cocaine (Metab) Scrn, Ur: NEGATIVE ng/mL
Creatinine(Crt), U: 111 mg/dL (ref 20.0–300.0)
Fentanyl, Urine: NEGATIVE pg/mL
Meperidine Screen, Urine: NEGATIVE ng/mL
Methadone Screen, Urine: NEGATIVE ng/mL
OXYCODONE+OXYMORPHONE UR QL SCN: NEGATIVE ng/mL
Opiate Scrn, Ur: NEGATIVE ng/mL
Ph of Urine: 5.7 (ref 4.5–8.9)
Phencyclidine Qn, Ur: NEGATIVE ng/mL
Propoxyphene Scrn, Ur: NEGATIVE ng/mL
Tramadol Screen, Urine: NEGATIVE ng/mL

## 2020-07-11 MED ORDER — ALPRAZOLAM 2 MG PO TABS: 2.0000 mg | ORAL_TABLET | Freq: Two times a day (BID) | ORAL | 2 refills | Status: DC

## 2020-07-11 NOTE — Telephone Encounter (Signed)
What is the name of the medication? alprazolam Duanne Moron) 2 MG tablet [[id number]]  Have you contacted your pharmacy to request a refill? Yes, she is needing a refill. She had an appointment with Dr. Loletha Grayer on 07/12/20, but this had to be rescheduled to 07/25/20 because provider is not in office.   Which pharmacy would you like this sent to? Pharmacy  [facility] [place] - [address] [place] [address]  [address] [place], [place] [address]  Phone:  [phone]  Fax:  [phone]  DEA #:  [id number]     Patient notified that their request is being sent to the clinical staff for review and that they should receive a call once it is complete. If they do not receive a call within 72 hours they can check with their pharmacy or our office.

## 2020-07-11 NOTE — Telephone Encounter (Signed)
Last OV 02/10/20 Last fill 06/08/20  #60/0 Next OV 07/25/20

## 2020-07-11 NOTE — Telephone Encounter (Signed)
Rx refilled.

## 2020-07-12 ENCOUNTER — Telehealth: Payer: BC Managed Care – PPO | Admitting: Family Medicine

## 2020-07-25 ENCOUNTER — Encounter: Payer: Self-pay | Admitting: Family Medicine

## 2020-07-25 ENCOUNTER — Telehealth (INDEPENDENT_AMBULATORY_CARE_PROVIDER_SITE_OTHER): Payer: BC Managed Care – PPO | Admitting: Family Medicine

## 2020-07-25 VITALS — BP 122/76 | HR 84 | Temp 98.4°F | Ht 67.0 in | Wt 260.6 lb

## 2020-07-25 DIAGNOSIS — Z91018 Allergy to other foods: Secondary | ICD-10-CM

## 2020-07-25 DIAGNOSIS — F988 Other specified behavioral and emotional disorders with onset usually occurring in childhood and adolescence: Secondary | ICD-10-CM

## 2020-07-25 DIAGNOSIS — F339 Major depressive disorder, recurrent, unspecified: Secondary | ICD-10-CM | POA: Diagnosis not present

## 2020-07-25 DIAGNOSIS — F419 Anxiety disorder, unspecified: Secondary | ICD-10-CM

## 2020-07-25 MED ORDER — EPINEPHRINE 0.3 MG/0.3ML IJ SOAJ: 0.3000 mg | INTRAMUSCULAR | 0 refills | Status: AC | PRN

## 2020-07-25 MED ORDER — AMPHETAMINE-DEXTROAMPHETAMINE 30 MG PO TABS: 30.0000 mg | ORAL_TABLET | Freq: Two times a day (BID) | ORAL | 0 refills | Status: DC

## 2020-07-25 MED ORDER — HYDROXYZINE PAMOATE 50 MG PO CAPS: 50.0000 mg | ORAL_CAPSULE | Freq: Every day | ORAL | 3 refills | Status: DC

## 2020-07-25 MED ORDER — SERTRALINE HCL 100 MG PO TABS: 150.0000 mg | ORAL_TABLET | Freq: Every day | ORAL | 3 refills | Status: DC

## 2020-07-25 MED ORDER — ALPRAZOLAM 2 MG PO TABS: 2.0000 mg | ORAL_TABLET | Freq: Two times a day (BID) | ORAL | 2 refills | Status: DC

## 2020-07-25 NOTE — Progress Notes (Signed)
Virtual Visit via Video Note  I connected with Kayla Jacobson on 07/25/20 at 10:30 AM EDT by a video enabled telemedicine application and verified that I am speaking with the correct person using two identifiers. Location patient: home Location provider: work  Persons participating in the virtual visit: patient, provider  I discussed the limitations of evaluation and management by telemedicine and the availability of in person appointments. The patient expressed understanding and agreed to proceed.  Chief Complaint  Patient presents with   Follow-up    Meds review    HPI: Kayla Jacobson is a 45 y.o. female patient seen today for f/u on ADD, anxiety, bipolar depression and medication refills. Pt is taking adderall 30mg  BID for ADD. Was previously on vyvanse but this became cost-prohibitive.  For anxiety and depression, pt takes zoloft 150mg  daily, vistaril 50mg  qHS, xanax 2mg  BID. Today pt reports meds are generally effective. No side effects.  She has been home from work d/t anxiety and feels panic attacks are less.   Past Medical History:  Diagnosis Date   ADD (attention deficit disorder)    Anxiety    Arthritis    Bipolar disorder ([place])    Chiari malformation    Depression    bipolar   Dysrhythmia    Fibromyalgia    Headache    migraines   Seasonal asthma     Past Surgical History:  Procedure Laterality Date   ABLATION     CARPAL TUNNEL RELEASE     bilateral   CHOLECYSTECTOMY     HYSTEROSCOPY W/ ENDOMETRIAL ABLATION     LUMBAR LAMINECTOMY/DECOMPRESSION MICRODISCECTOMY Left 11/21/2015   Procedure: CENTRAL LUMBAR DECOMPRESSION MICRODISCECTOMY L4-L5, MICRODISECTOMY L4-L5 LEFT; FORAMINOTOMY L4 AND L5 ROOT LEFT;  Surgeon: Latanya Maudlin, MD;  Location: [facility];  Service: Orthopedics;  Laterality: Left;   PANNICULECTOMY Bilateral 12/27/2019   TUBAL LIGATION     WISDOM TOOTH EXTRACTION      Family History  Problem Relation Age of Onset   Uterine cancer Mother     Pulmonary embolism Mother    Anxiety disorder Mother    Depression Mother    Hyperlipidemia Mother    Breast cancer Other        grandmother   Diabetes Father    Bipolar disorder Sister    Hypersomnolence Sister    Chiari malformation Sister    Chiari malformation Brother    Lumbar disc disease Brother    Polymyalgia rheumatica Maternal Aunt    ADD / ADHD Son    Breast cancer Maternal Grandmother    Clotting disorder Maternal Grandfather    Lupus Paternal Grandmother    Heart attack Paternal Grandfather     Social History   Tobacco Use   Smoking status: Former    Types: E-cigarettes   Smokeless tobacco: Never  Scientific laboratory technician Use: Former  Substance Use Topics   Alcohol use: Yes    Comment: occasional-states 2 month   Drug use: Never     Current Outpatient Medications:    albuterol (VENTOLIN HFA) 108 (90 Base) MCG/ACT inhaler, INHALE 2 PUFFS INTO THE LUNGS EVERY 4 HOURS AS NEEDED FOR WHEEZING OR SHORTNESS OF BREATH, Disp: 18 g, Rfl: 2   Cholecalciferol 5000 units TABS, Take 1 tablet daily by mouth., Disp: , Rfl:    tiZANidine (ZANAFLEX) 4 MG tablet, TAKE 1 TABLET(4 MG) BY MOUTH THREE TIMES DAILY, Disp: 90 tablet, Rfl: 2   alprazolam (XANAX) 2 MG tablet, Take 1 tablet (  2 mg total) by mouth 2 (two) times daily., Disp: 60 tablet, Rfl: 2   amphetamine-dextroamphetamine (ADDERALL) 30 MG tablet, Take 1 tablet by mouth 2 (two) times daily., Disp: 60 tablet, Rfl: 0   EPINEPHrine 0.3 mg/0.3 mL IJ SOAJ injection, Inject 0.3 mg into the muscle as needed for anaphylaxis., Disp: 2 each, Rfl: 0   hydrOXYzine (VISTARIL) 50 MG capsule, Take 1 capsule (50 mg total) by mouth at bedtime., Disp: 90 capsule, Rfl: 3   levalbuterol (XOPENEX) 0.63 MG/3ML nebulizer solution, Take 3 mLs (0.63 mg total) by nebulization once for 1 dose., Disp: 3 mL, Rfl: 12   sertraline (ZOLOFT) 100 MG tablet, Take 1.5 tablets (150 mg total) by mouth daily., Disp: 135 tablet, Rfl: 3  Allergies  Allergen  Reactions   Cymbalta [Duloxetine Hcl]     SI   Influenza Vaccines Anaphylaxis and Swelling   Other Anaphylaxis    WALNUTS---THROAT CLOSES UP   Advair Diskus [Fluticasone-Salmeterol] Hives   Gabapentin Other (See Comments)    it affected her motivation.    Trazodone And Nefazodone     "made me nuts," "I wanted to pull my hair out."      ROS: See pertinent positives and negatives per HPI.   EXAM:  VITALS per patient if applicable: BP 284/13   Pulse 84   Temp 98.4 F (36.9 C)   Ht 5\' 7"  (1.702 m)   Wt 260 lb 9.6 oz (118.2 kg)   SpO2 97%   BMI 40.82 kg/m   Wt Readings from Last 3 Encounters:  07/25/20 260 lb 9.6 oz (118.2 kg)  02/10/20 260 lb 9.6 oz (118.2 kg)  02/07/20 260 lb 2 oz (118 kg)   Temp Readings from Last 3 Encounters:  07/25/20 98.4 F (36.9 C)  02/10/20 98.1 F (36.7 C) (Temporal)  02/07/20 98.2 F (36.8 C) (Oral)   BP Readings from Last 3 Encounters:  07/25/20 122/76  02/10/20 124/78  02/07/20 134/80   Pulse Readings from Last 3 Encounters:  07/25/20 84  02/10/20 87  02/07/20 98     GENERAL: alert, oriented, appears well and in no acute distress  HEENT: atraumatic, conjunctiva clear, no obvious abnormalities on inspection of external nose and ears  NECK: normal movements of the head and neck  LUNGS: on inspection no signs of respiratory distress, breathing rate appears normal, no obvious gross SOB, gasping or wheezing, no conversational dyspnea  CV: no obvious cyanosis  MS: moves all visible extremities without noticeable abnormality  PSYCH/NEURO: pleasant and cooperative, speech and thought processing grossly intact   ASSESSMENT AND PLAN:  1. Attention deficit disorder, unspecified hyperactivity presence - stable, overall controlled - database reviewed and appropriate - UDS UTD Refill: - amphetamine-dextroamphetamine (ADDERALL) 30 MG tablet; Take 1 tablet by mouth 2 (two) times daily.  Dispense: 60 tablet; Refill: 0 - f/u in 3  mo and pt understands she will need to establish with new PCP sine I am leaving the practice  2. Anxiety disorder, unspecified type - stable, overall controlled - database reviewed and appropriate - UDS UTD - alprazolam (XANAX) 2 MG tablet; Take 1 tablet (2 mg total) by mouth 2 (two) times daily.  Dispense: 60 tablet; Refill: 2 - sertraline (ZOLOFT) 100 MG tablet; Take 1.5 tablets (150 mg total) by mouth daily.  Dispense: 135 tablet; Refill: 3 - hydrOXYzine (VISTARIL) 50 MG capsule; Take 1 capsule (50 mg total) by mouth at bedtime.  Dispense: 90 capsule; Refill: 3 - f/u in 3 mo and  pt understands she will need to establish with new PCP sine I am leaving the practice  3. Depression, recurrent ([place]) - stable, controlled Refill: - sertraline (ZOLOFT) 100 MG tablet; Take 1.5 tablets (150 mg total) by mouth daily.  Dispense: 135 tablet; Refill: 3  4. Allergy to walnuts Refill: - EPINEPHrine 0.3 mg/0.3 mL IJ SOAJ injection; Inject 0.3 mg into the muscle as needed for anaphylaxis.  Dispense: 2 each; Refill: 0   I discussed the assessment and treatment plan with the patient. The patient was provided an opportunity to ask questions and all were answered. The patient agreed with the plan and demonstrated an understanding of the instructions.   The patient was advised to call back or seek an in-person evaluation if the symptoms worsen or if the condition fails to improve as anticipated.   Letta Median, DO

## 2020-08-09 ENCOUNTER — Encounter: Payer: Self-pay | Admitting: Family Medicine

## 2020-08-10 ENCOUNTER — Other Ambulatory Visit: Payer: Self-pay

## 2020-08-10 DIAGNOSIS — G8929 Other chronic pain: Secondary | ICD-10-CM

## 2020-08-10 MED ORDER — TIZANIDINE HCL 4 MG PO TABS
ORAL_TABLET | ORAL | 2 refills | Status: DC
Start: 2020-08-10 — End: 2020-11-07

## 2020-09-22 ENCOUNTER — Encounter: Payer: Self-pay | Admitting: Family Medicine

## 2020-09-24 NOTE — Telephone Encounter (Signed)
Please review and advise on patients my chart message.  Thanks.  Dm/cma

## 2020-09-24 NOTE — Telephone Encounter (Signed)
Please disregard message about medication due to pharmacy now has the correct dose and is ready for her.    Thanks. Dm/cma

## 2020-10-23 ENCOUNTER — Encounter: Payer: BC Managed Care – PPO | Admitting: Family Medicine

## 2020-10-24 ENCOUNTER — Telehealth: Payer: BC Managed Care – PPO | Admitting: Family Medicine

## 2020-11-06 ENCOUNTER — Encounter: Payer: BC Managed Care – PPO | Admitting: Family Medicine

## 2020-11-07 ENCOUNTER — Other Ambulatory Visit: Payer: Self-pay

## 2020-11-07 ENCOUNTER — Telehealth (INDEPENDENT_AMBULATORY_CARE_PROVIDER_SITE_OTHER): Payer: BC Managed Care – PPO | Admitting: Family Medicine

## 2020-11-07 ENCOUNTER — Encounter: Payer: Self-pay | Admitting: Family Medicine

## 2020-11-07 VITALS — BP 124/78 | HR 80

## 2020-11-07 DIAGNOSIS — F339 Major depressive disorder, recurrent, unspecified: Secondary | ICD-10-CM

## 2020-11-07 DIAGNOSIS — G8929 Other chronic pain: Secondary | ICD-10-CM

## 2020-11-07 DIAGNOSIS — F988 Other specified behavioral and emotional disorders with onset usually occurring in childhood and adolescence: Secondary | ICD-10-CM

## 2020-11-07 DIAGNOSIS — F4310 Post-traumatic stress disorder, unspecified: Secondary | ICD-10-CM | POA: Diagnosis not present

## 2020-11-07 DIAGNOSIS — F419 Anxiety disorder, unspecified: Secondary | ICD-10-CM

## 2020-11-07 DIAGNOSIS — M546 Pain in thoracic spine: Secondary | ICD-10-CM

## 2020-11-07 MED ORDER — AMPHETAMINE-DEXTROAMPHETAMINE 30 MG PO TABS: 30.0000 mg | ORAL_TABLET | Freq: Two times a day (BID) | ORAL | 0 refills | Status: DC

## 2020-11-07 MED ORDER — ALPRAZOLAM 2 MG PO TABS: 2.0000 mg | ORAL_TABLET | Freq: Two times a day (BID) | ORAL | 2 refills | Status: DC

## 2020-11-07 MED ORDER — TIZANIDINE HCL 4 MG PO TABS: ORAL_TABLET | ORAL | 2 refills | Status: DC

## 2020-11-07 NOTE — Progress Notes (Signed)
Virtual Visit via Video   I connected with patient on 11/07/20 at 12:30 PM EDT by a video enabled telemedicine application and verified that I am speaking with the correct person using two identifiers.  Location patient: Home Location provider: [facility], Office Persons participating in the virtual visit: Patient, Provider, Dorris Claiborne Billings C)  I discussed the limitations of evaluation and management by telemedicine and the availability of in person appointments. The patient expressed understanding and agreed to proceed.  Subjective:   HPI:   New to establish.  Previously saw Dr Bryan Lemma, prior to that was Dr Deborra Medina.  Anxiety/Depression- chronic problem, on Sertraline 150mg , hydroxyzine 50mg  QHS prn, Alprazolam 2mg  BID.  Not currently working outside the home.  Pt reports things are currently 'ok' and 'improving'.  Previously she would stay home, stay in bed, and cry.  Mom is now living w/ her and providing additional emotional support.  Not currently working w/ therapist or psychiatrist b/c she has had to start over multiple times as therapists have come and gone and this is very hard for her.  PTSD- pt has had a number of triggering events over the course of her life.  Has learned breathing techniques and better stress management.  Is able to now go out and participate in life much more than compared to earlier this year.   ADHD- chronic problem, on Adderall 30mg  BID.  Was previously on Vyvanse 70mg  but her insurance switched and medication is no longer affordable.  Chronic back and neck pain- pt takes the tizanidine as needed to relax muscles in neck and back.  Also helps to prevent migraines.  ROS:   See pertinent positives and negatives per HPI.  Patient Active Problem List   Diagnosis Date Noted   History of 2019 novel coronavirus disease (COVID-19) 01/10/2019   Pneumonia due to COVID-19 virus 01/10/2019   Depression, recurrent ([place]) 05/19/2018   Severe obesity (BMI  >= 40) ([place]) 05/19/2018   ADD (attention deficit disorder) 05/13/2017   PTSD (post-traumatic stress disorder) 05/13/2017   Anxiety disorder 05/13/2017   Chiari malformation type I ([place]) 05/13/2017   PCOS (polycystic ovarian syndrome) 05/13/2017    Social History   Tobacco Use   Smoking status: Former    Types: E-cigarettes   Smokeless tobacco: Never  Substance Use Topics   Alcohol use: Yes    Comment: occasional-states 2 month    Current Outpatient Medications:    albuterol (VENTOLIN HFA) 108 (90 Base) MCG/ACT inhaler, INHALE 2 PUFFS INTO THE LUNGS EVERY 4 HOURS AS NEEDED FOR WHEEZING OR SHORTNESS OF BREATH, Disp: 18 g, Rfl: 2   alprazolam (XANAX) 2 MG tablet, Take 1 tablet (2 mg total) by mouth 2 (two) times daily., Disp: 60 tablet, Rfl: 2   amphetamine-dextroamphetamine (ADDERALL) 30 MG tablet, Take 1 tablet by mouth 2 (two) times daily., Disp: 60 tablet, Rfl: 0   Cholecalciferol 5000 units TABS, Take 1 tablet daily by mouth., Disp: , Rfl:    EPINEPHrine 0.3 mg/0.3 mL IJ SOAJ injection, Inject 0.3 mg into the muscle as needed for anaphylaxis., Disp: 2 each, Rfl: 0   hydrOXYzine (VISTARIL) 50 MG capsule, Take 1 capsule (50 mg total) by mouth at bedtime., Disp: 90 capsule, Rfl: 3   sertraline (ZOLOFT) 100 MG tablet, Take 1.5 tablets (150 mg total) by mouth daily., Disp: 135 tablet, Rfl: 3   tiZANidine (ZANAFLEX) 4 MG tablet, TAKE 1 TABLET(4 MG) BY MOUTH THREE TIMES DAILY, Disp: 90 tablet, Rfl: 2   levalbuterol (  XOPENEX) 0.63 MG/3ML nebulizer solution, Take 3 mLs (0.63 mg total) by nebulization once for 1 dose., Disp: 3 mL, Rfl: 12  Allergies  Allergen Reactions   Cymbalta [Duloxetine Hcl]     SI   Influenza Vaccines Anaphylaxis and Swelling   Other Anaphylaxis    WALNUTS---THROAT CLOSES UP   Advair Diskus [Fluticasone-Salmeterol] Hives   Gabapentin Other (See Comments)    it affected her motivation.    Trazodone And Nefazodone     "made me nuts," "I wanted to pull my hair  out."    Objective:   BP 124/78   Pulse 80   SpO2 98%  AAOx3, NAD Obese NCAT, EOMI No obvious CN deficits Coloring WNL Pt is able to speak clearly, coherently without shortness of breath or increased work of breathing.  Thought process is linear.  Mood is appropriate.   Assessment and Plan:   Depression/Anxiety- new to provider, ongoing for pt.  Feels currently stable on Sertraline 150mg  daily, Hydroxyzine QHS and Alprazolam BID.  Refills provided.  PTSD- new to provider, ongoing for pt.  She is very vague about the cause of her PTSD and I did not want to push.  She reports she has had multiple triggers over the years but she is better learning how to cope.  Earlier this year she was barely able to leave the house.  She reports she is participating in life more readily at this time.  She is not interested in starting therapy at this time as she feels is would be more harmful than helpful to re-hash these events and start over.  Will follow.  ADHD- chronic problem.  Currently on Adderall BID.  Doesn't feel this controls her sxs as well as Vyvanse did but insurance no longer covers the Vyvanse.  Will provide refill for pt.  Chronic back pain- ongoing issue.  Needs refill on Tizanidine.  She was previously maintained on Soma but that is no longer covered.  Obesity- ongoing issue for pt.  No recorded weight today but pt admits to stress eating and limited exercise due to chronic pain.  Will follow.   Annye Asa, MD 11/07/2020

## 2020-12-07 ENCOUNTER — Encounter: Payer: Self-pay | Admitting: Family Medicine

## 2020-12-07 MED ORDER — CARISOPRODOL 350 MG PO TABS: 350.0000 mg | ORAL_TABLET | Freq: Three times a day (TID) | ORAL | 0 refills | Status: DC

## 2020-12-07 NOTE — Telephone Encounter (Signed)
Pt requesting medication change due to insurance coverage changes.

## 2020-12-11 ENCOUNTER — Other Ambulatory Visit: Payer: Self-pay | Admitting: Family Medicine

## 2020-12-11 DIAGNOSIS — F988 Other specified behavioral and emotional disorders with onset usually occurring in childhood and adolescence: Secondary | ICD-10-CM

## 2020-12-11 MED ORDER — AMPHETAMINE-DEXTROAMPHETAMINE 30 MG PO TABS: 30.0000 mg | ORAL_TABLET | Freq: Two times a day (BID) | ORAL | 0 refills | Status: DC

## 2021-01-09 ENCOUNTER — Other Ambulatory Visit: Payer: Self-pay | Admitting: Family Medicine

## 2021-01-09 ENCOUNTER — Telehealth: Payer: Self-pay

## 2021-01-09 DIAGNOSIS — F988 Other specified behavioral and emotional disorders with onset usually occurring in childhood and adolescence: Secondary | ICD-10-CM

## 2021-01-09 NOTE — Telephone Encounter (Signed)
Caller name:[facility]   On DPR? :Yes  Call back number:[phone]  Provider they see: Birdie Riddle   Reason for call:Needs diagnoses code for alprazolam (XANAX) 2 MG tablet , carisoprodol (SOMA) 350 MG tablet

## 2021-01-09 NOTE — Telephone Encounter (Signed)
Patient is requesting a refill of the following medications: Requested Prescriptions   Pending Prescriptions Disp Refills   amphetamine-dextroamphetamine (ADDERALL) 30 MG tablet 60 tablet 0    Sig: Take 1 tablet by mouth 2 (two) times daily.    Date of patient request: 01/09/21 Last office visit: 12/11/20 Date of last refill: 11/07/20 Last refill amount: 60

## 2021-01-09 NOTE — Telephone Encounter (Signed)
Called pharmacy and updated Dx codes

## 2021-01-10 MED ORDER — AMPHETAMINE-DEXTROAMPHETAMINE 30 MG PO TABS: 30.0000 mg | ORAL_TABLET | Freq: Two times a day (BID) | ORAL | 0 refills | Status: DC

## 2021-02-13 ENCOUNTER — Encounter: Payer: Self-pay | Admitting: Family Medicine

## 2021-02-14 DIAGNOSIS — M79645 Pain in left finger(s): Secondary | ICD-10-CM | POA: Diagnosis not present

## 2021-03-01 DIAGNOSIS — Z87891 Personal history of nicotine dependence: Secondary | ICD-10-CM | POA: Diagnosis not present

## 2021-03-01 DIAGNOSIS — M65312 Trigger thumb, left thumb: Secondary | ICD-10-CM | POA: Diagnosis not present

## 2021-03-01 DIAGNOSIS — Z79899 Other long term (current) drug therapy: Secondary | ICD-10-CM | POA: Diagnosis not present

## 2021-03-01 DIAGNOSIS — Z6841 Body Mass Index (BMI) 40.0 and over, adult: Secondary | ICD-10-CM | POA: Diagnosis not present

## 2021-03-01 DIAGNOSIS — G935 Compression of brain: Secondary | ICD-10-CM | POA: Diagnosis not present

## 2021-03-01 DIAGNOSIS — E782 Mixed hyperlipidemia: Secondary | ICD-10-CM | POA: Diagnosis not present

## 2021-03-01 DIAGNOSIS — Z888 Allergy status to other drugs, medicaments and biological substances status: Secondary | ICD-10-CM | POA: Diagnosis not present

## 2021-03-01 DIAGNOSIS — J45909 Unspecified asthma, uncomplicated: Secondary | ICD-10-CM | POA: Diagnosis not present

## 2021-03-01 DIAGNOSIS — M797 Fibromyalgia: Secondary | ICD-10-CM | POA: Diagnosis not present

## 2021-03-01 DIAGNOSIS — F32A Depression, unspecified: Secondary | ICD-10-CM | POA: Diagnosis not present

## 2021-03-01 DIAGNOSIS — F419 Anxiety disorder, unspecified: Secondary | ICD-10-CM | POA: Diagnosis not present

## 2021-03-01 DIAGNOSIS — Z887 Allergy status to serum and vaccine status: Secondary | ICD-10-CM | POA: Diagnosis not present

## 2021-03-18 ENCOUNTER — Other Ambulatory Visit: Payer: Self-pay | Admitting: Family Medicine

## 2021-03-19 ENCOUNTER — Other Ambulatory Visit: Payer: Self-pay | Admitting: Family Medicine

## 2021-03-19 MED ORDER — CARISOPRODOL 350 MG PO TABS: ORAL_TABLET | ORAL | 0 refills | Status: DC

## 2021-03-19 NOTE — Progress Notes (Signed)
Prescription filled at pt's request ?

## 2021-05-15 ENCOUNTER — Telehealth: Payer: Self-pay

## 2021-05-15 MED ORDER — CARISOPRODOL 350 MG PO TABS: ORAL_TABLET | ORAL | 0 refills | Status: DC

## 2021-05-15 NOTE — Telephone Encounter (Signed)
Prescription sent to pharmacy.

## 2021-05-20 ENCOUNTER — Ambulatory Visit: Payer: BC Managed Care – PPO | Admitting: Family Medicine

## 2021-05-20 ENCOUNTER — Telehealth: Payer: Self-pay

## 2021-05-20 ENCOUNTER — Encounter: Payer: Self-pay | Admitting: Family Medicine

## 2021-05-20 DIAGNOSIS — F419 Anxiety disorder, unspecified: Secondary | ICD-10-CM

## 2021-05-20 MED ORDER — ALPRAZOLAM 2 MG PO TABS: 2.0000 mg | ORAL_TABLET | Freq: Two times a day (BID) | ORAL | 0 refills | Status: DC

## 2021-05-20 NOTE — Telephone Encounter (Signed)
I will provide pt w/ medication to avoid withdrawal symptoms but it will be a smaller quantity since she did not keep her appt today. ?

## 2021-05-21 NOTE — Telephone Encounter (Signed)
Spoke w/ pt and advised pt that smaller dose was sent in due to her no showing her appt yesterday . She stated she is helping her son move to another [place] at this time but she will call to make an appt .  ?

## 2021-09-03 IMAGING — MR MR CERVICAL SPINE W/O CM
5 series · 39 of 48 positions shown · non-contrast
Comparison: 11/22/2018.  11/09/2017.

CLINICAL DATA: Chronic back pain.  Chronic neck pain.

EXAM:
MRI CERVICAL SPINE WITHOUT CONTRAST
TECHNIQUE: Multiplanar, multisequence MR imaging of the cervical spine was
performed. No intravenous contrast was administered.

[Series 1: T2 · sagittal · 3.0mm · 0.62mm/px · 6 of 15 slices shown (1 of 2)]
[im 1/15]
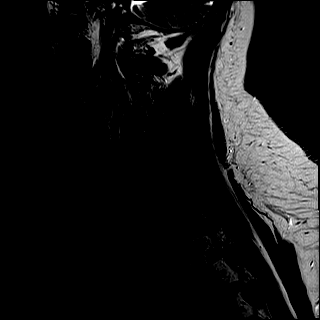
[im 3/15]
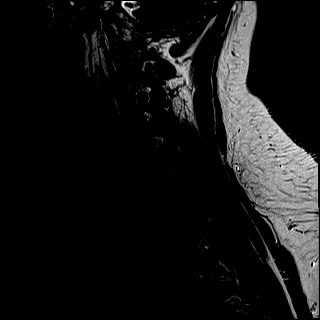
[im 6/15]
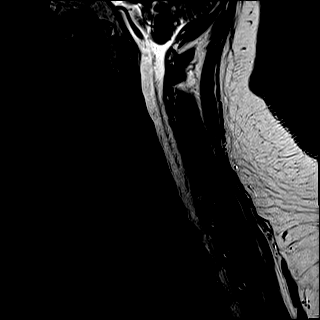
[im 9/15]
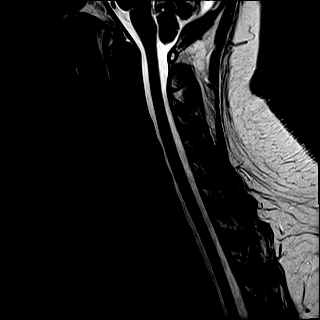
[im 12/15]
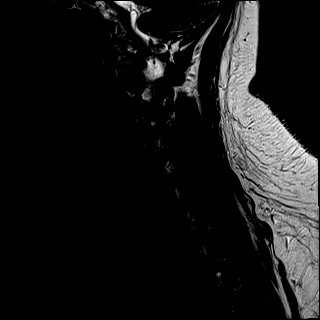
[im 15/15]
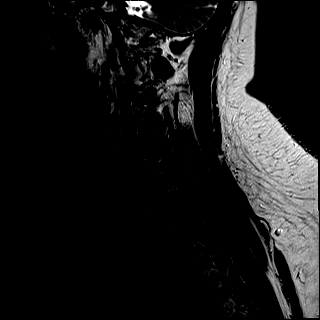

[Series 2: T1 · sagittal · 3.0mm · 0.62mm/px · 6 of 15 slices shown]
[im 1/15]
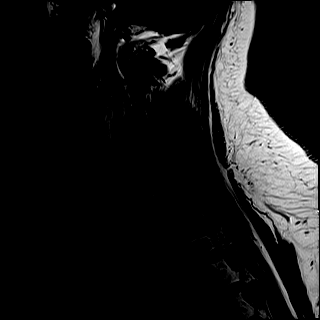
[im 3/15]
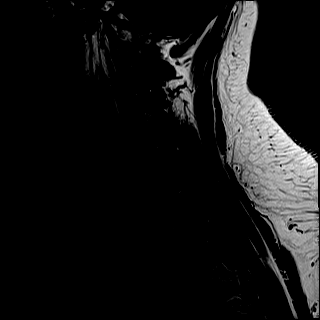
[im 6/15]
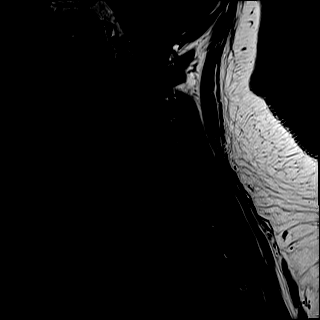
[im 9/15]
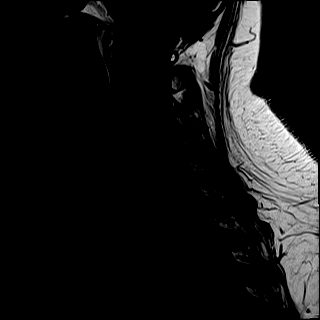
[im 12/15]
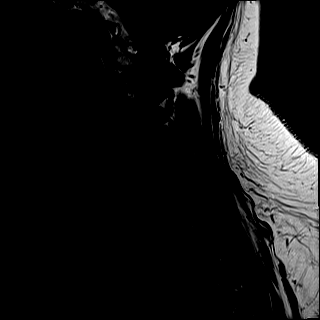
[im 15/15]
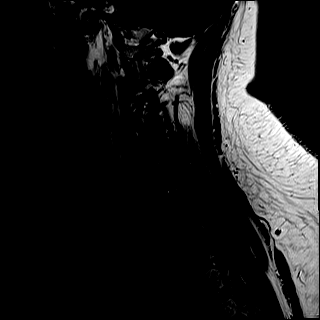

[Series 3: STIR · sagittal · 3.0mm · 0.78mm/px · 6 of 15 slices shown]
[im 1/15]
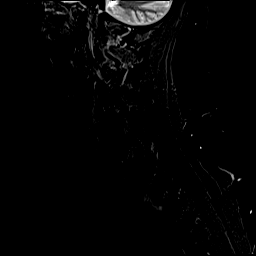
[im 3/15]
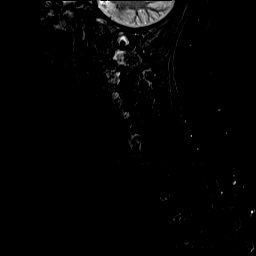
[im 6/15]
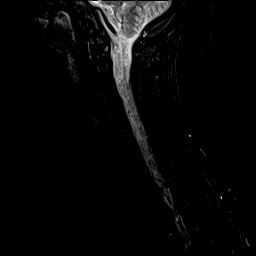
[im 9/15]
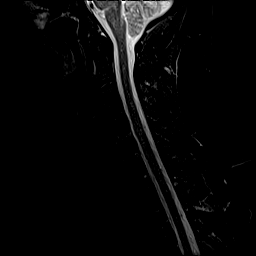
[im 12/15]
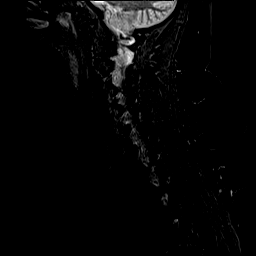
[im 15/15]
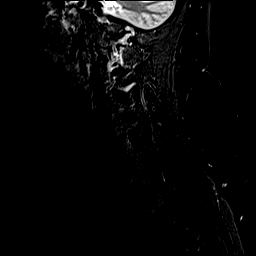

[Series 4: T2 · axial · 3.0mm · 0.66mm/px · z∈[-306,-185]mm · 13 of 40 slices shown (2 of 2)]
[im 1/40]
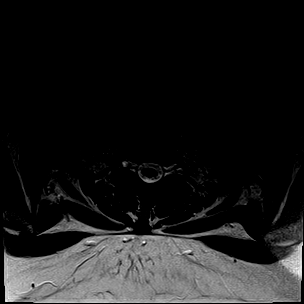
[im 3/40]
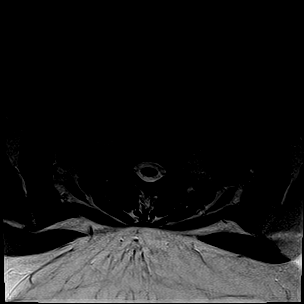
[im 6/40]
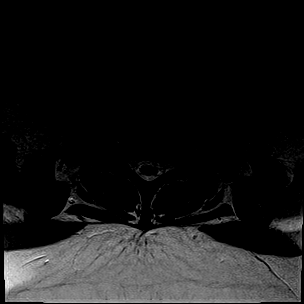
[im 9/40]
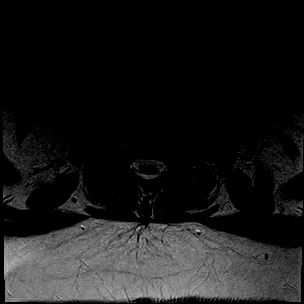
[im 12/40]
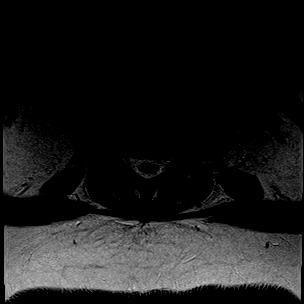
[im 14/40]
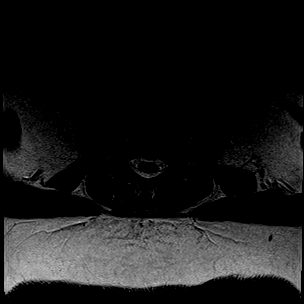
[im 17/40]
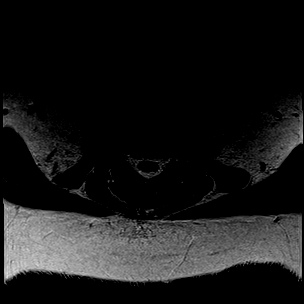
[im 20/40]
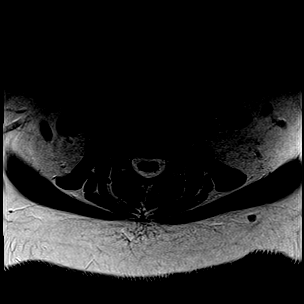
[im 23/40]
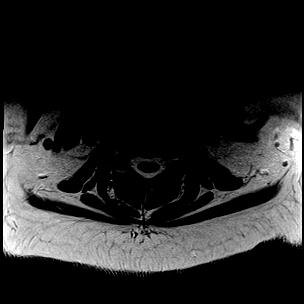
[im 26/40]
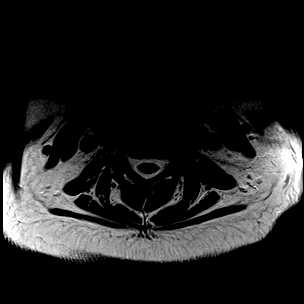
[im 28/40]
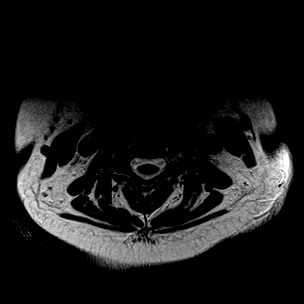
[im 34/40]
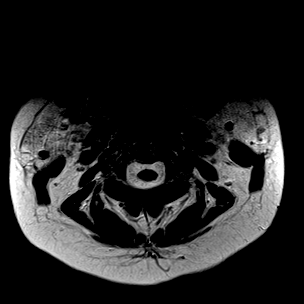
[im 40/40]
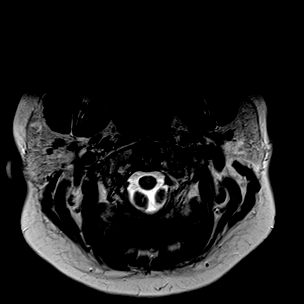

[Series 5: GRE · axial · 3.0mm · 0.35mm/px · z∈[-303,-182]mm · 8 of 40 slices shown]
[im 1/40]
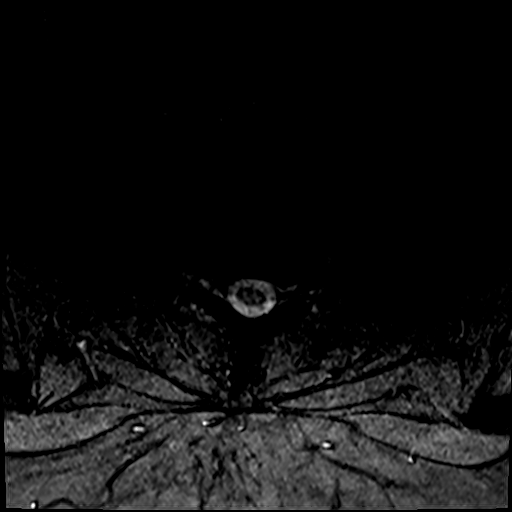
[im 6/40]
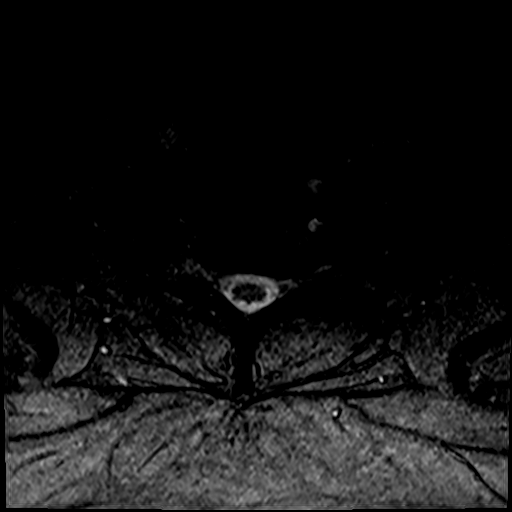
[im 12/40]
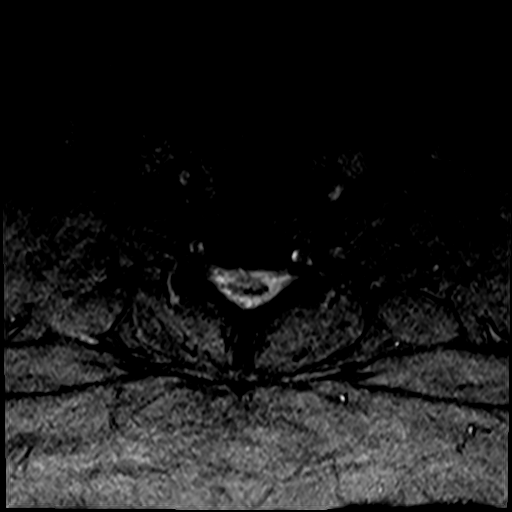
[im 17/40]
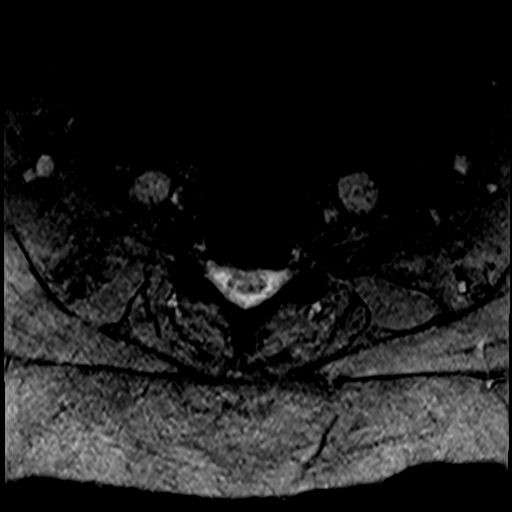
[im 23/40]
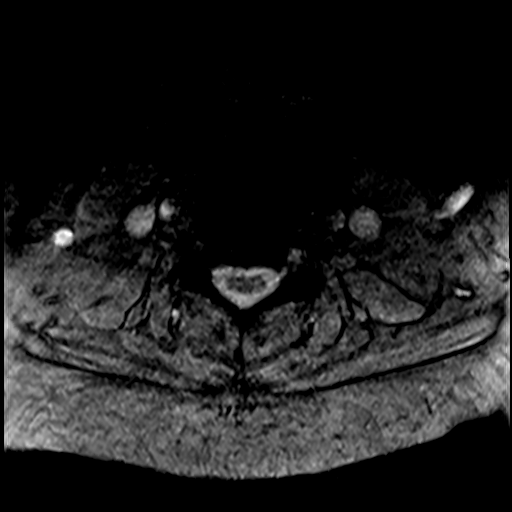
[im 28/40]
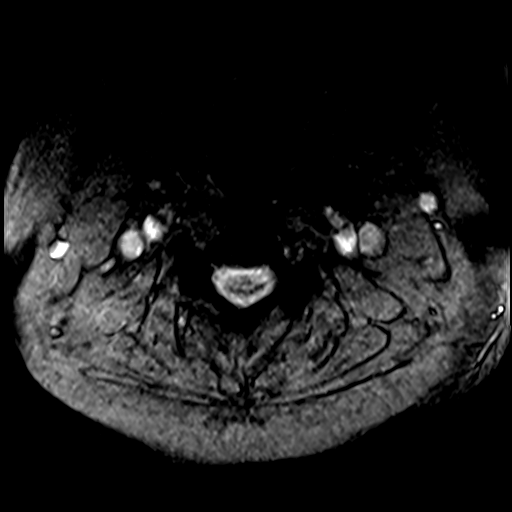
[im 34/40]
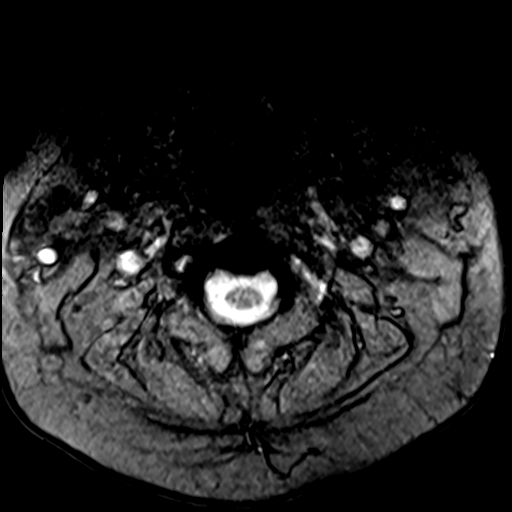
[im 40/40]
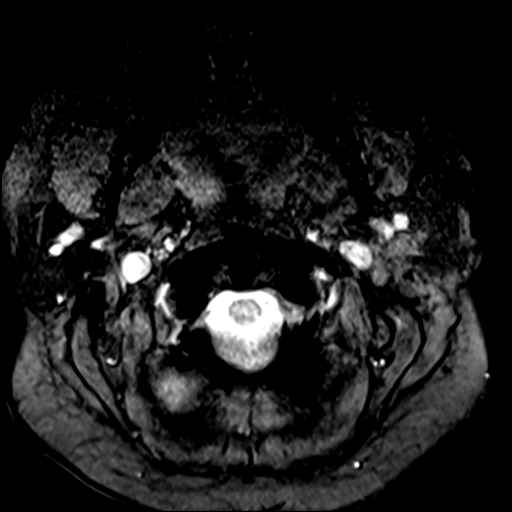

[39 of 48 positions shown; findings below may reference images not displayed]

FINDINGS: Alignment: Straightening of the normal cervical lordosis.

Vertebrae: No fracture or primary bone lesion.

Cord: No cord compression or primary cord lesion.

Posterior Fossa, vertebral arteries, paraspinal tissues: Negative

Disc levels:

Minimal non-compressive spondylosis at C3-4, C4-5 and C5-6. No disc
herniation or stenosis of the canal or foramina. No facet
arthropathy.
IMPRESSION: Negative study of the cervical spine. Minimal non-compressive
spondylosis C3-4, C4-5 and C5-6.

## 2022-05-10 DIAGNOSIS — J45909 Unspecified asthma, uncomplicated: Secondary | ICD-10-CM | POA: Insufficient documentation

## 2022-05-10 DIAGNOSIS — I4891 Unspecified atrial fibrillation: Secondary | ICD-10-CM | POA: Diagnosis not present

## 2022-05-10 DIAGNOSIS — R002 Palpitations: Secondary | ICD-10-CM | POA: Diagnosis not present

## 2022-05-10 DIAGNOSIS — Z9049 Acquired absence of other specified parts of digestive tract: Secondary | ICD-10-CM | POA: Diagnosis not present

## 2022-05-11 ENCOUNTER — Emergency Department (HOSPITAL_BASED_OUTPATIENT_CLINIC_OR_DEPARTMENT_OTHER): Payer: BC Managed Care – PPO

## 2022-05-11 ENCOUNTER — Emergency Department (HOSPITAL_BASED_OUTPATIENT_CLINIC_OR_DEPARTMENT_OTHER)
Admission: EM | Admit: 2022-05-11 | Discharge: 2022-05-11 | Disposition: A | Discharge status: Home / Self Care | Payer: BC Managed Care – PPO | Attending: Emergency Medicine | Admitting: Emergency Medicine

## 2022-05-11 ENCOUNTER — Other Ambulatory Visit: Payer: Self-pay

## 2022-05-11 ENCOUNTER — Encounter (HOSPITAL_BASED_OUTPATIENT_CLINIC_OR_DEPARTMENT_OTHER): Payer: Self-pay

## 2022-05-11 DIAGNOSIS — Z9049 Acquired absence of other specified parts of digestive tract: Secondary | ICD-10-CM | POA: Diagnosis not present

## 2022-05-11 DIAGNOSIS — R002 Palpitations: Secondary | ICD-10-CM | POA: Diagnosis not present

## 2022-05-11 DIAGNOSIS — I4891 Unspecified atrial fibrillation: Secondary | ICD-10-CM

## 2022-05-11 LAB — COMPREHENSIVE METABOLIC PANEL
ALT: 23 U/L (ref 0–44)
AST: 25 U/L (ref 15–41)
Albumin: 4.4 g/dL (ref 3.5–5.0)
Alkaline Phosphatase: 86 U/L (ref 38–126)
Anion gap: 8 (ref 5–15)
BUN: 10 mg/dL (ref 6–20)
CO2: 29 mmol/L (ref 22–32)
Calcium: 9.1 mg/dL (ref 8.9–10.3)
Chloride: 104 mmol/L (ref 98–111)
Creatinine, Ser: 0.76 mg/dL (ref 0.44–1.00)
GFR, Estimated: >60 mL/min (ref >60)
Glucose, Bld: 104 mg/dL — ABNORMAL HIGH (ref 70–99)
Potassium: 3.5 mmol/L (ref 3.5–5.1)
Sodium: 141 mmol/L (ref 135–145)
Total Bilirubin: 0.5 mg/dL (ref 0.3–1.2)
Total Protein: 8 g/dL (ref 6.5–8.1)

## 2022-05-11 LAB — CBC WITH DIFFERENTIAL/PLATELET
Abs Immature Granulocytes: 0.01 10*3/uL (ref 0.00–0.07)
Basophils Absolute: 0 10*3/uL (ref 0.0–0.1)
Basophils Relative: 1 %
Eosinophils Absolute: 0.2 10*3/uL (ref 0.0–0.5)
Eosinophils Relative: 3 %
HCT: 44.5 % (ref 36.0–46.0)
Hemoglobin: 15.1 g/dL — ABNORMAL HIGH (ref 12.0–15.0)
Immature Granulocytes: 0 %
Lymphocytes Relative: 34 %
Lymphs Abs: 2.3 10*3/uL (ref 0.7–4.0)
MCH: 30.9 pg (ref 26.0–34.0)
MCHC: 33.9 g/dL (ref 30.0–36.0)
MCV: 91 fL (ref 80.0–100.0)
Monocytes Absolute: 0.4 10*3/uL (ref 0.1–1.0)
Monocytes Relative: 6 %
Neutro Abs: 3.9 10*3/uL (ref 1.7–7.7)
Neutrophils Relative %: 56 %
Platelets: 252 10*3/uL (ref 150–400)
RBC: 4.89 MIL/uL (ref 3.87–5.11)
RDW: 13.1 % (ref 11.5–15.5)
WBC: 6.9 10*3/uL (ref 4.0–10.5)
nRBC: 0 % (ref 0.0–0.2)

## 2022-05-11 LAB — TROPONIN I (HIGH SENSITIVITY)
Troponin I (High Sensitivity): 4 ng/L (ref <18)
Troponin I (High Sensitivity): 5 ng/L (ref <18)

## 2022-05-11 LAB — MAGNESIUM: Magnesium: 2.1 mg/dL (ref 1.7–2.4)

## 2022-05-11 MED ORDER — METOPROLOL TARTRATE 5 MG/5ML IV SOLN
2.5000 mg | Freq: Once | INTRAVENOUS | Status: AC
  Administered 2022-05-11: 2.5 mg via INTRAVENOUS
  Filled 2022-05-11: qty 5

## 2022-05-11 MED ORDER — IOHEXOL 350 MG/ML SOLN
75.0000 mL | Freq: Once | INTRAVENOUS | Status: AC | PRN
  Administered 2022-05-11: 75 mL via INTRAVENOUS

## 2022-05-11 MED ORDER — METOPROLOL TARTRATE 25 MG PO TABS
25.0000 mg | ORAL_TABLET | Freq: Once | ORAL | Status: AC
  Administered 2022-05-11: 25 mg via ORAL
  Filled 2022-05-11: qty 1

## 2022-05-11 MED ORDER — METOPROLOL TARTRATE 25 MG PO TABS: 25.0000 mg | ORAL_TABLET | Freq: Two times a day (BID) | ORAL | 1 refills | Status: DC

## 2022-05-11 NOTE — ED Provider Notes (Signed)
[place] EMERGENCY DEPARTMENT AT [facility] [place] Provider Note   CSN: [id number] Arrival date & time: 05/10/22  2359     History  No chief complaint on file.   Kayla Jacobson is a 47 y.o. female.  The history is provided by the patient.  Kayla Jacobson is a 47 y.o. female who presents to the Emergency Department complaining of palpitations.  She presents to the emergency department for evaluation of palpitations that started at 10 PM.  This was when she woke up and needed to use the bathroom for episode of diarrhea.  It was 1 isolated episode without any hematochezia or melena.  She felt like she was going to pass out when this episode began and she has associated chest heaviness and feels like her heart is beating irregularly.  She also has associated shortness of breath.  No fever, nausea, vomiting, leg swelling or pain.  She has experienced multiple episodes of PVCs in the past that she describes as a hiccup every now and again and she states that this episode today is very different.  She has a history of asthma but does not take any medications.  LMP was 2 months ago. No tobacco, alcohol or drugs.  She has a family history of antiphospholipid syndrome but she has not been tested for it.        Home Medications Prior to Admission medications   Medication Sig Start Date End Date Taking? Authorizing Provider  metoprolol tartrate (LOPRESSOR) 25 MG tablet Take 1 tablet (25 mg total) by mouth 2 (two) times daily. 05/11/22  Yes Tilden Fossa, MD  albuterol (VENTOLIN HFA) 108 (90 Base) MCG/ACT inhaler INHALE 2 PUFFS INTO THE LUNGS EVERY 4 HOURS AS NEEDED FOR WHEEZING OR SHORTNESS OF BREATH 12/09/19   Cirigliano, Jearld Lesch, DO  alprazolam Prudy Feeler) 2 MG tablet Take 1 tablet (2 mg total) by mouth 2 (two) times daily. 05/20/21   Sheliah Hatch, MD  amphetamine-dextroamphetamine (ADDERALL) 30 MG tablet Take 1 tablet by mouth 2 (two) times daily. 01/10/21   Sheliah Hatch,  MD  carisoprodol (SOMA) 350 MG tablet TAKE 1 TABLET(350 MG) BY MOUTH THREE TIMES DAILY 05/15/21   Sheliah Hatch, MD  Cholecalciferol 5000 units TABS Take 1 tablet daily by mouth.    [provider]  EPINEPHrine 0.3 mg/0.3 mL IJ SOAJ injection Inject 0.3 mg into the muscle as needed for anaphylaxis. 07/25/20   Cirigliano, Jearld Lesch, DO  hydrOXYzine (VISTARIL) 50 MG capsule Take 1 capsule (50 mg total) by mouth at bedtime. 07/25/20   Cirigliano, Jearld Lesch, DO  levalbuterol (XOPENEX) 0.63 MG/3ML nebulizer solution Take 3 mLs (0.63 mg total) by nebulization once for 1 dose. 03/21/18 03/21/18  Dianne Dun, MD  sertraline (ZOLOFT) 100 MG tablet Take 1.5 tablets (150 mg total) by mouth daily. 07/25/20   Cirigliano, Jearld Lesch, DO      Allergies    Cymbalta [duloxetine hcl], Influenza vaccines, Other, Advair diskus [fluticasone-salmeterol], Gabapentin, and Trazodone and nefazodone    Review of Systems   Review of Systems  All other systems reviewed and are negative.   Physical Exam Updated Vital Signs BP (!) 134/120   Pulse 76   Temp 98 F (36.7 C) (Oral)   Resp 19   Ht 5\' 7"  (1.702 m)   Wt 118.2 kg   LMP 03/11/2022   SpO2 95%   BMI 40.81 kg/m  Physical Exam Vitals and nursing note reviewed.  Constitutional:  Appearance: She is well-developed.  HENT:     Head: Normocephalic and atraumatic.  Cardiovascular:     Rate and Rhythm: Tachycardia present. Rhythm irregular.     Heart sounds: No murmur heard. Pulmonary:     Effort: Pulmonary effort is normal. No respiratory distress.     Breath sounds: Normal breath sounds.  Abdominal:     Palpations: Abdomen is soft.     Tenderness: There is no abdominal tenderness. There is no guarding or rebound.  Musculoskeletal:        General: No swelling or tenderness.  Skin:    General: Skin is warm and dry.  Neurological:     Mental Status: She is alert and oriented to person, place, and time.  Psychiatric:        Behavior: Behavior  normal.     ED Results / Procedures / Treatments   Labs (all labs ordered are listed, but only abnormal results are displayed) Labs Reviewed  CBC WITH DIFFERENTIAL/PLATELET - Abnormal; Notable for the following components:      Result Value   Hemoglobin 15.1 (*)    All other components within normal limits  COMPREHENSIVE METABOLIC PANEL - Abnormal; Notable for the following components:   Glucose, Bld 104 (*)    All other components within normal limits  MAGNESIUM  TROPONIN I (HIGH SENSITIVITY)  TROPONIN I (HIGH SENSITIVITY)    EKG EKG Interpretation  Date/Time:  Sunday May 11 2022 00:08:14 EDT Ventricular Rate:  142 PR Interval:    QRS Duration: 83 QT Interval:  302 QTC Calculation: 438 R Axis:   52 Text Interpretation: Atrial fibrillation Low voltage, precordial leads Borderline T wave abnormalities Confirmed by Tilden Fossa [phone]) on 05/11/2022 1:03:06 AM  Radiology CT Angio Chest PE W/Cm &/Or Wo Cm  Result Date: 05/11/2022 CLINICAL DATA:  Pulmonary embolism (PE) suspected, high prob, presyncope, palpitations EXAM: CT ANGIOGRAPHY CHEST WITH CONTRAST TECHNIQUE: Multidetector CT imaging of the chest was performed using the standard protocol during bolus administration of intravenous contrast. Multiplanar CT image reconstructions and MIPs were obtained to evaluate the vascular anatomy. RADIATION DOSE REDUCTION: This exam was performed according to the departmental dose-optimization program which includes automated exposure control, adjustment of the mA and/or kV according to patient size and/or use of iterative reconstruction technique. CONTRAST:  75mL OMNIPAQUE IOHEXOL 350 MG/ML SOLN COMPARISON:  06/16/2018 FINDINGS: Cardiovascular: No significant coronary artery calcification. Calcification of the aortic valve leaflets noted. Global cardiac size within normal limits. No pericardial effusion. Central pulmonary arteries are mildly enlarged in keeping with changes of pulmonary  arterial hypertension. No intraluminal filling defect identified through the segmental level to suggest acute pulmonary embolism. The thoracic aorta is unremarkable. Mediastinum/Nodes: No enlarged mediastinal, hilar, or axillary lymph nodes. Thyroid gland, trachea, and esophagus demonstrate no significant findings. Lungs/Pleura: Lungs are clear. No pleural effusion or pneumothorax. Upper Abdomen: Status post cholecystectomy.  No acute abnormality. Musculoskeletal: No chest wall abnormality. No acute or significant osseous findings. Review of the MIP images confirms the above findings. IMPRESSION: 1. No pulmonary embolism. No acute intrathoracic pathology identified. 2. Calcification of the aortic valve leaflets. Echocardiography may be helpful for further evaluation. 3. Morphologic changes in keeping with pulmonary arterial hypertension. Electronically Signed   By: Helyn Numbers M.D.   On: 05/11/2022 01:45   DG Chest Port 1 View  Result Date: 05/11/2022 CLINICAL DATA:  Palpitations EXAM: PORTABLE CHEST 1 VIEW COMPARISON:  01/06/2019 FINDINGS: Stable cardiomediastinal silhouette. No focal consolidation, pleural effusion, or pneumothorax. No displaced  rib fractures. IMPRESSION: No active disease. Electronically Signed   By: Minerva Fester M.D.   On: 05/11/2022 01:15    Procedures Procedures    Medications Ordered in ED Medications  metoprolol tartrate (LOPRESSOR) injection 2.5 mg (2.5 mg Intravenous Given 05/11/22 0112)  iohexol (OMNIPAQUE) 350 MG/ML injection 75 mL (75 mLs Intravenous Contrast Given 05/11/22 0130)  metoprolol tartrate (LOPRESSOR) tablet 25 mg (25 mg Oral Given 05/11/22 0351)    ED Course/ Medical Decision Making/ A&P                             Medical Decision Making Amount and/or Complexity of Data Reviewed Labs: ordered. Radiology: ordered.  Risk Prescription drug management.   Patient with history of asthma here for evaluation of palpitations.  She is in new onset A-fib  with RVR at time of ED evaluation.  She has 2 negative troponins and no significant electrolyte abnormalities.  No anemia.  Given her family history of antiphospholipid syndrome and patient's associated shortness of breath a CTA was obtained, which is negative for PE.  She was treated with a one-time dose of metoprolol with good rate control but she did not convert to sinus rhythm.  Discussed risks and benefits of cardioversion and patient declines cardioversion in the emergency department.  Also discussed risks and benefits of anticoagulation and patient also declined anticoagulation at this time.  Will start on metoprolol with referral to A-fib clinic with close return precautions for new or concerning symptoms.  This patients CHA2DS2-VASc Score and unadjusted Ischemic Stroke Rate (% per year) is equal to 0.6 % stroke rate/year from a score of 1  Above score calculated as 1 point each if present [CHF, HTN, DM, Vascular=MI/PAD/Aortic Plaque, Age if 65-74, or Female] Above score calculated as 2 points each if present [Age > 75, or Stroke/TIA/TE]           Final Clinical Impression(s) / ED Diagnoses Final diagnoses:  Atrial fibrillation with rapid ventricular response (HCC)    Rx / DC Orders ED Discharge Orders          Ordered    metoprolol tartrate (LOPRESSOR) 25 MG tablet  2 times daily        05/11/22 0344    Amb Referral to AFIB Clinic        05/11/22 0345              Tilden Fossa, MD 05/11/22 0505

## 2022-05-11 NOTE — ED Triage Notes (Signed)
Pt began having palpitations after walking to restroom and felt like "I was about to pass out".  Pt has had this before but it went away.  Palpitations are constant.  Pt has had intermittent sternal CP 3/10.

## 2022-05-13 ENCOUNTER — Inpatient Hospital Stay (HOSPITAL_COMMUNITY)
Admission: RE | Admit: 2022-05-13 | Discharge: 2022-05-13 | Disposition: A | Discharge status: Home / Self Care | Payer: BC Managed Care – PPO | Source: Ambulatory Visit | Attending: Physician Assistant | Admitting: Physician Assistant

## 2022-05-13 ENCOUNTER — Ambulatory Visit (HOSPITAL_COMMUNITY)
Admission: RE | Admit: 2022-05-13 | Discharge: 2022-05-13 | Disposition: A | Discharge status: Home / Self Care | Payer: BC Managed Care – PPO | Source: Ambulatory Visit | Attending: Physician Assistant | Admitting: Physician Assistant

## 2022-05-13 ENCOUNTER — Other Ambulatory Visit (HOSPITAL_COMMUNITY): Payer: Self-pay | Admitting: *Deleted

## 2022-05-13 VITALS — BP 160/92 | HR 55 | Ht 67.0 in | Wt 278.4 lb

## 2022-05-13 DIAGNOSIS — Z79899 Other long term (current) drug therapy: Secondary | ICD-10-CM | POA: Insufficient documentation

## 2022-05-13 DIAGNOSIS — R001 Bradycardia, unspecified: Secondary | ICD-10-CM | POA: Diagnosis not present

## 2022-05-13 DIAGNOSIS — I480 Paroxysmal atrial fibrillation: Secondary | ICD-10-CM | POA: Diagnosis not present

## 2022-05-13 DIAGNOSIS — Z6841 Body Mass Index (BMI) 40.0 and over, adult: Secondary | ICD-10-CM | POA: Insufficient documentation

## 2022-05-13 DIAGNOSIS — R400 Somnolence: Secondary | ICD-10-CM | POA: Diagnosis not present

## 2022-05-13 DIAGNOSIS — Z713 Dietary counseling and surveillance: Secondary | ICD-10-CM | POA: Insufficient documentation

## 2022-05-13 DIAGNOSIS — R0683 Snoring: Secondary | ICD-10-CM | POA: Insufficient documentation

## 2022-05-13 DIAGNOSIS — Z832 Family history of diseases of the blood and blood-forming organs and certain disorders involving the immune mechanism: Secondary | ICD-10-CM | POA: Diagnosis not present

## 2022-05-13 DIAGNOSIS — R030 Elevated blood-pressure reading, without diagnosis of hypertension: Secondary | ICD-10-CM | POA: Diagnosis not present

## 2022-05-13 DIAGNOSIS — E669 Obesity, unspecified: Secondary | ICD-10-CM | POA: Diagnosis not present

## 2022-05-13 MED ORDER — METOPROLOL TARTRATE 25 MG PO TABS: 25.0000 mg | ORAL_TABLET | Freq: Two times a day (BID) | ORAL | 1 refills | Status: DC

## 2022-05-13 NOTE — Progress Notes (Addendum)
Primary Care Physician: Pcp, No Primary Cardiologist: none Primary Electrophysiologist: none Referring Physician: [facility]   Kayla Jacobson is a 47 y.o. female with a history of bipolar disorder, fibromyalgia, atrial fibrillation who presents for consultation in the [facility].  The patient was initially diagnosed with atrial fibrillation 05/11/22 after presenting to the ED with symptoms of tachypalpitations, SOB, and chest heaviness. Her symptoms started suddenly after an episode of diarrhea. ECG at the ED showed rapid afib. She was given metoprolol for rate control. Patient declined starting anticoagulation at that time. Patient has a CHADS2VASC score of 1. In hindsight, patient has had a "floppy" feeling in her chest off and on for about one year prior.   Today, she is in SR. She did have some palpitations one evening since leaving the ED and took an extra dose of metoprolol which resolved her symptoms. She denies alcohol use but does admit to significant snoring and daytime somnolence.   Today, she denies symptoms of chest pain, shortness of breath, orthopnea, PND, lower extremity edema, dizziness, presyncope, syncope, bleeding, or neurologic sequela. The patient is tolerating medications without difficulties and is otherwise without complaint today.    Atrial Fibrillation Risk Factors:  she does have symptoms or diagnosis of sleep apnea. she is agreeable to sleep study.  she does not have a history of rheumatic fever. she does not have a history of alcohol use. The patient does not have a history of early familial atrial fibrillation or other arrhythmias.  she has a BMI of Body mass index is 43.6 kg/m.Marland Kitchen Filed Weights   05/13/22 1349  Weight: 126.3 kg    Family History  Problem Relation Age of Onset   Uterine cancer Mother    Pulmonary embolism Mother    Anxiety disorder Mother    Depression Mother    Hyperlipidemia Mother    Breast  cancer Other        grandmother   Diabetes Father    Bipolar disorder Sister    Hypersomnolence Sister    Chiari malformation Sister    Chiari malformation Brother    Lumbar disc disease Brother    Polymyalgia rheumatica Maternal Aunt    ADD / ADHD Son    Breast cancer Maternal Grandmother    Clotting disorder Maternal Grandfather    Lupus Paternal Grandmother    Heart attack Paternal Grandfather      Atrial Fibrillation Management history:  Previous antiarrhythmic drugs: none Previous cardioversions: none Previous ablations: none Anticoagulation history: none   Past Medical History:  Diagnosis Date   ADD (attention deficit disorder)    Anxiety    Arthritis    Bipolar disorder (HCC)    Chiari malformation    Depression    bipolar   Dysrhythmia    Fibromyalgia    Headache    migraines   Seasonal asthma    Past Surgical History:  Procedure Laterality Date   ABLATION     CARPAL TUNNEL RELEASE     bilateral   CHOLECYSTECTOMY     HYSTEROSCOPY W/ ENDOMETRIAL ABLATION     LUMBAR LAMINECTOMY/DECOMPRESSION MICRODISCECTOMY Left 11/21/2015   Procedure: CENTRAL LUMBAR DECOMPRESSION MICRODISCECTOMY L4-L5, MICRODISECTOMY L4-L5 LEFT; FORAMINOTOMY L4 AND L5 ROOT LEFT;  Surgeon: Ranee Gosselin, MD;  Location: [facility];  Service: Orthopedics;  Laterality: Left;   PANNICULECTOMY Bilateral 12/27/2019   TUBAL LIGATION     WISDOM TOOTH EXTRACTION      Current Outpatient Medications  Medication Sig  Dispense Refill   albuterol (VENTOLIN HFA) 108 (90 Base) MCG/ACT inhaler INHALE 2 PUFFS INTO THE LUNGS EVERY 4 HOURS AS NEEDED FOR WHEEZING OR SHORTNESS OF BREATH 18 g 2   EPINEPHrine 0.3 mg/0.3 mL IJ SOAJ injection Inject 0.3 mg into the muscle as needed for anaphylaxis. 2 each 0   metoprolol tartrate (LOPRESSOR) 25 MG tablet Take 1 tablet (25 mg total) by mouth 2 (two) times daily. 60 tablet 1   No current facility-administered medications for this encounter.    Allergies  Allergen  Reactions   Cymbalta [Duloxetine Hcl]     SI   Influenza Vaccines Anaphylaxis and Swelling   Other Anaphylaxis    WALNUTS---THROAT CLOSES UP   Advair Diskus [Fluticasone-Salmeterol] Hives   Salmeterol Hives   Gabapentin Other (See Comments)    it affected her motivation.    Trazodone And Nefazodone     "made me nuts," "I wanted to pull my hair out."    Social History   Socioeconomic History   Marital status: Married    Spouse name: Not on file   Number of children: Not on file   Years of education: Not on file   Highest education level: Not on file  Occupational History   Not on file  Tobacco Use   Smoking status: Former    Types: E-cigarettes   Smokeless tobacco: Never  Vaping Use   Vaping Use: Former  Substance and Sexual Activity   Alcohol use: Yes    Comment: occasional-states 2 month   Drug use: Never   Sexual activity: Not on file  Other Topics Concern   Not on file  Social History Narrative   Not on file   Social Determinants of Health   Financial Resource Strain: Not on file  Food Insecurity: Not on file  Transportation Needs: Not on file  Physical Activity: Not on file  Stress: Not on file  Social Connections: Not on file  Intimate Partner Violence: Not on file     ROS- All systems are reviewed and negative except as per the HPI above.  Physical Exam: Vitals:   05/13/22 1349  BP: (!) 160/92  Pulse: (!) 55  Weight: 126.3 kg  Height: 5\' 7"  (1.702 m)    GEN- The patient is a well appearing female, alert and oriented x 3 today.   Head- normocephalic, atraumatic Eyes-  Sclera clear, conjunctiva pink Ears- hearing intact Oropharynx- clear Neck- supple  Lungs- Clear to ausculation bilaterally, normal work of breathing Heart- Regular rate and rhythm, no murmurs, rubs or gallops  GI- soft, NT, ND, + BS Extremities- no clubbing, cyanosis, or edema MS- no significant deformity or atrophy Skin- no rash or lesion Psych- euthymic mood, full  affect Neuro- strength and sensation are intact  Wt Readings from Last 3 Encounters:  05/13/22 126.3 kg  05/11/22 118.2 kg  07/25/20 118.2 kg    EKG today demonstrates  SB Vent. rate 55 BPM PR interval 164 ms QRS duration 68 ms QT/QTcB 392/375 ms   Epic records are reviewed at length today.  CHA2DS2-VASc Score = 1  The patient's score is based upon: CHF History: 0 HTN History: 0 Diabetes History: 0 Stroke History: 0 Vascular Disease History: 0 Age Score: 0 Gender Score: 1       ASSESSMENT AND PLAN: 1. Paroxysmal Atrial Fibrillation (ICD10:  I48.0) The patient's CHA2DS2-VASc score is 1, indicating a 0.6% annual risk of stroke.   General education about afib provided and questions answered. We  also discussed her stroke risk and the risks and benefits of anticoagulation. Check echocardiogram Will check 2 week Zio monitor to assess arrhythmia burden.  Continue Lopressor 25 mg BID, may take an extra dose PRN for heart racing.  Not currently on anticoagulation with low CV score. She has a family h/o antiphospholipid syndrome, I have encouraged her to get tested with PCP as this may have implications on what anticoagulant we could use if needed.   2. Obesity Body mass index is 43.6 kg/m. Lifestyle modification was discussed at length including eating a Mediterranean style diet.   3. Snoring/daytime somnolence  The importance of adequate treatment of sleep apnea was discussed today in order to improve our ability to maintain sinus rhythm long term. Will order sleep study, patient would prefer home test.   4. Elevated BP No h/o HTN Patient admits she is very anxious about her visit today.  Will not make changes today, reassess on follow up.   Follow up in the AF clinic in one month. Patient also requesting referral to establish care with Dr Royann Shivers.    Jorja Loa PA-C Afib Clinic [facility] [address] [place], [place]  [address] [phone] 05/13/2022 2:15 PM

## 2022-05-13 NOTE — Addendum Note (Signed)
Encounter addended by: Danice Goltz, PA on: 05/13/2022 3:13 PM  Actions taken: Clinical Note Signed

## 2022-05-28 ENCOUNTER — Ambulatory Visit (HOSPITAL_COMMUNITY)
Admission: RE | Admit: 2022-05-28 | Discharge: 2022-05-28 | Disposition: A | Discharge status: Home / Self Care | Payer: BC Managed Care – PPO | Source: Ambulatory Visit | Attending: Physician Assistant | Admitting: Physician Assistant

## 2022-05-28 DIAGNOSIS — I4891 Unspecified atrial fibrillation: Secondary | ICD-10-CM | POA: Diagnosis not present

## 2022-05-28 DIAGNOSIS — I480 Paroxysmal atrial fibrillation: Secondary | ICD-10-CM | POA: Diagnosis not present

## 2022-05-28 LAB — ECHOCARDIOGRAM COMPLETE
Area-P 1/2: 4.21 cm2
Calc EF: 62.4 %
S' Lateral: 2.9 cm
Single Plane A2C EF: 61.3 %
Single Plane A4C EF: 61.7 %

## 2022-05-28 NOTE — Progress Notes (Signed)
Echocardiogram 2D Echocardiogram has been performed.  Toni Amend 05/28/2022, 3:30 PM

## 2022-06-03 NOTE — Addendum Note (Signed)
Encounter addended by: Shona Simpson, RN on: 06/03/2022 11:41 AM  Actions taken: Imaging Exam ended

## 2022-06-10 ENCOUNTER — Encounter (HOSPITAL_COMMUNITY): Payer: Self-pay

## 2022-06-12 ENCOUNTER — Ambulatory Visit (HOSPITAL_COMMUNITY): Payer: BC Managed Care – PPO | Admitting: Physician Assistant

## 2022-07-18 ENCOUNTER — Encounter: Payer: Self-pay | Admitting: Cardiovascular Disease

## 2022-07-18 ENCOUNTER — Other Ambulatory Visit (HOSPITAL_COMMUNITY): Payer: Self-pay

## 2022-07-18 ENCOUNTER — Ambulatory Visit: Payer: BC Managed Care – PPO | Attending: Cardiovascular Disease | Admitting: Cardiovascular Disease

## 2022-07-18 VITALS — BP 129/84 | HR 65 | Ht 67.0 in | Wt 275.4 lb

## 2022-07-18 DIAGNOSIS — I480 Paroxysmal atrial fibrillation: Secondary | ICD-10-CM | POA: Diagnosis not present

## 2022-07-18 DIAGNOSIS — G4710 Hypersomnia, unspecified: Secondary | ICD-10-CM | POA: Diagnosis not present

## 2022-07-18 DIAGNOSIS — R0683 Snoring: Secondary | ICD-10-CM

## 2022-07-18 MED ORDER — METOPROLOL TARTRATE 25 MG PO TABS
25.0000 mg | ORAL_TABLET | Freq: Two times a day (BID) | ORAL | 3 refills | Status: AC
  Filled 2022-07-18: qty 180, 90d supply, fill #0

## 2022-07-18 NOTE — Progress Notes (Signed)
Cardiology Office Note:  .   Date:  07/18/2022  ID:  Kayla Jacobson, DOB 08-26-1975, MRN [id number] PCP: Pcp, No  [facility] Providers Cardiologist:  None     Kayla Jacobson is a 47 y.o. female who is being seen today for the evaluation of paroxysmal atrial fibrillation at the request of Fenton, Clint R, PA.  History of Present Illness: .   Kayla Jacobson is a 47 y.o. female with recently diagnosed paroxysmal atrial fibrillation, morbid obesity, but without a history of hypertension, diabetes mellitus or other serious chronic medical conditions.  She rarely uses bronchodilators for asthma, no more than once a month.  She was seen in the emergency room with a rapid palpitations, shortness of breath and chest pressure on 05/11/2022 and was found to have atrial fibrillation rapid ventricular rate, treated with metoprolol for rate control and resolving spontaneously.  Retrospectively she had nausea palpitations going back for as much is a year before that.  Due to a very low embolic risk (CHA2DS2-VASc score of 1 for gender only) she was not started on anticoagulation.  She does not have a history of stroke or TIA symptoms.  She subsequently wore an arrhythmia monitor that during 2 weeks of monitoring did not show any evidence of atrial fibrillation and no other arrhythmia except for 2 short episodes of atrial tachycardia of 4 and 6 beats respectively, isolated PACs and PVCs.  Snoring has woken herself from sleep snoring.  She has a very prominent complaints of fatigue and has not felt that she has had restorative sleep since the age of 61.  She is "always tired" , she has daytime hypersomnolence and can fall asleep when watching television or if she just "gets comfortable".  She has not fallen asleep while driving.  STOP-BANG score is 5.  She started Ozempic and has titrated up to the 0.5 mg dose about a week ago.  She has lost about 7 pounds so far.  She would like to get  down to 200 pounds eventually.  She is carefully watching her diet.  Medical problems include fibromyalgia, mild Chiari malformation documented on brain MRI 2009 history of ADD/anxiety, bipolar depression, bilateral carpal tunnel release and lumbar spine decompression microdiscectomy, severe walnut allergy.  Grandfather had a myocardial infarction but not at a young age.  ROS: The patient specifically denies any chest pain at rest exertion, dyspnea at rest or with exertion, orthopnea, paroxysmal nocturnal dyspnea, syncope, focal neurological deficits, intermittent claudication, lower extremity edema, unexplained weight gain, cough, hemoptysis or wheezing.   Studies Reviewed: Marland Kitchen   ECG 05/11/2022 shows atrial fibrillation with rapid ventricular response but is otherwise a normal tracing. ECG 05/13/2022 shows mild sinus bradycardia, normal tracing (so-called anterior infarct is due to misplacement of lead V3).   Echo 05/28/2022    1. Left ventricular ejection fraction, by estimation, is 60 to 65%. The  left ventricle has normal function. The left ventricle has no regional  wall motion abnormalities. Left ventricular diastolic parameters were  normal.   2. Right ventricular systolic function is normal. The right ventricular  size is normal.   3. The mitral valve is normal in structure. Trivial mitral valve  regurgitation. No evidence of mitral stenosis.   4. The aortic valve is tricuspid. Aortic valve regurgitation is not  visualized. Aortic valve sclerosis is present, with no evidence of aortic  valve stenosis.   5. The inferior vena cava is normal in size with greater than 50%  respiratory variability, suggesting right atrial pressure of 3 mmHg.   Comparison(s): No prior Echocardiogram.   Arrhythmia monitor 06/03/2022 See above  CHA2DS2-VASc Score = 1   This indicates a 0.6% annual risk of stroke. The patient's score is based upon: CHF History: 0 HTN History: 0 Diabetes History:  0 Stroke History: 0 Vascular Disease History: 0 Age Score: 0 Gender Score: 1         STOP-Bang Score:  5      Physical Exam:   VS:  BP 129/84 (BP Location: Left Arm, Patient Position: Sitting, Cuff Size: Large)   Pulse 65   Ht 5\' 7"  (1.702 m)   Wt 275 lb 6.4 oz (124.9 kg)   SpO2 96%   BMI 43.13 kg/m    Wt Readings from Last 3 Encounters:  07/18/22 275 lb 6.4 oz (124.9 kg)  05/13/22 278 lb 6.4 oz (126.3 kg)  05/11/22 260 lb 9.3 oz (118.2 kg)    GEN: Well nourished, well developed in no acute distress.  Morbidly obese NECK: No JVD; No carotid bruits CARDIAC: RRR, no murmurs, rubs, gallops RESPIRATORY:  Clear to auscultation without rales, wheezing or rhonchi  ABDOMEN: Soft, non-tender, non-distended EXTREMITIES:  No edema; No deformity   ASSESSMENT AND PLAN: .    AFib: So far with a single documented clinical event.  Low embolic risk, not on anticoagulation.  Beta-blockers have helped with palpitations suppression.  She notices if she misses a dose that she has more palpitations.  No evidence of underlying structural heart disease.  Strong clinical suspicion that OSA may be the driver.  Advised that she acquire some type of personal electronic monitor such as a smart watch or Kardia device so that we can confirm the clinical suspicion of recurrent arrhythmia and hopefully avoid hospital visits. Hypersomnolence/snoring: Medical suspicion for obstructive sleep apnea.  She is already intently pursuing weight loss.  Will schedule for home sleep study and implement treatment with BiPAP if the study confirms the diagnosis. Morbid obesity: Trying to eat a healthy diet, taking Ozempic.  Exercise is important but limited by some of her musculoskeletal complaints.       Dispo: Follow-up after sleep study.  Signed, Thurmon Fair, MD

## 2022-07-18 NOTE — Patient Instructions (Signed)
Testing/Procedures:  WatchPAT?  Is a FDA cleared portable home sleep study test that uses a watch and 3 points of contact to monitor 7 different channels, including your heart rate, oxygen saturations, body position, snoring, and chest motion.  The study is easy to use from the comfort of your own home and accurately detect sleep apnea.  Before bed, you attach the chest sensor, attached the sleep apnea bracelet to your nondominant hand, and attach the finger probe.  After the study, the raw data is downloaded from the watch and scored for apnea events.   For more information: [email or web address]  Patient Testing Instructions:  Do not put battery into the device until bedtime when you are ready to begin the test. Please call the support number if you need assistance after following the instructions below: 24 hour support line- [phone] or [facility] support at [phone] (option 2)  Download the IntelWatchPAT One" app through the google play store or App Store  Be sure to turn on or enable access to bluetooth in settlings on your smartphone/ device  Make sure no other bluetooth devices are on and within the vicinity of your smartphone/ device and WatchPAT watch during testing.  Make sure to leave your smart phone/ device plugged in and charging all night.  When ready for bed:  Follow the instructions step by step in the WatchPAT One App to activate the testing device. For additional instructions, including video instruction, visit the WatchPAT One video on Youtube. You can search for WatchPat One within Youtube (video is 4 minutes and 18 seconds) or enter: https://youtube/[email or web address]?[email or web address]_vbiwxE Please note: You will be prompted to enter a Pin to connect via bluetooth when starting the test. The PIN will be assigned to you when you receive the test.  The device is disposable, but it recommended that you retain the device until you receive a call letting you know the study  has been received and the results have been interpreted.  We will let you know if the study did not transmit to [place] properly after the test is completed. You do not need to call us to confirm the receipt of the test.  Please complete the test within 48 hours of receiving PIN.   Frequently Asked Questions:  What is Watch Dennie Bible one?  A single use fully disposable home sleep apnea testing device and will not need to be returned after completion.  What are the requirements to use WatchPAT one?  The be able to have a successful watchpat one sleep study, you should have your Watch pat one device, your smart phone, watch pat one app, your PIN number and Internet access What type of phone do I need?  You should have a smart phone that uses Android 5.1 and above or any Iphone with IOS 10 and above How can I download the WatchPAT one app?  Based on your device type search for WatchPAT one app either in google play for android devices or APP store for Iphone's Where will I get my PIN for the study?  Your PIN will be provided by your physician's office. It is used for authentication and if you lose/forget your PIN, please reach out to your providers office.  I do not have Internet at home. Can I do WatchPAT one study?  WatchPAT One needs Internet connection throughout the night to be able to transmit the sleep data. You can use your home/local internet or your cellular's data package. However, it is always recommended to  use home/local Internet. It is estimated that between 20MB-30MB will be used with each study.However, the application will be looking for space in the phone to start the study.  What happens if I lose internet or bluetooth connection?  During the internet disconnection, your phone will not be able to transmit the sleep data. All the data, will be stored in your phone. As soon as the internet connection is back on, the phone will being sending the sleep data. During the bluetooth  disconnection, WatchPAT one will not be able to to send the sleep data to your phone. Data will be kept in the [facility] one until two devices have bluetooth connection back on. As soon as the connection is back on, WatchPAT one will send the sleep data to the phone.  How long do I need to wear the WatchPAT one?  After you start the study, you should wear the device at least 6 hours.  How far should I keep my phone from the device?  During the night, your phone should be within 15 feet.  What happens if I leave the room for restroom or other reasons?  Leaving the room for any reason will not cause any problem. As soon as your get back to the room, both devices will reconnect and will continue to send the sleep data. Can I use my phone during the sleep study?  Yes, you can use your phone as usual during the study. But it is recommended to put your watchpat one on when you are ready to go to bed.  How will I get my study results?  A soon as you completed your study, your sleep data will be sent to the provider. They will then share the results with you when they are ready.     Follow-Up: At [facility], you and your health needs are our priority.  As part of our continuing mission to provide you with exceptional heart care, we have created designated Provider Care Teams.  These Care Teams include your primary Cardiologist (physician) and Advanced Practice Providers (APPs -  Physician Assistants and Nurse Practitioners) who all work together to provide you with the care you need, when you need it.  We recommend signing up for the patient portal called "MyChart".  Sign up information is provided on this After Visit Summary.  MyChart is used to connect with patients for Virtual Visits (Telemedicine).  Patients are able to view lab/test results, encounter notes, upcoming appointments, etc.  Non-urgent messages can be sent to your provider as well.   To learn more about what you can do with  MyChart, go to [email or web address].    Your next appointment:   12 month(s)  Provider:   Thurmon Fair MD   Other Instructions Kardia mobile

## 2022-08-29 ENCOUNTER — Telehealth: Payer: Self-pay | Admitting: *Deleted

## 2022-08-29 NOTE — Telephone Encounter (Signed)
Prior Authorization for HST sent to [facility] via web portal. Tracking Number . Marland KitchenREADY-[id number] PA REQ

## 2022-09-02 NOTE — Telephone Encounter (Signed)
Called the patient, informed her that no PA is required and she can go ahead and do the home sleep study when she is ready. Gave her the Pin # [id number]. She verbalized understanding.

## 2022-09-25 ENCOUNTER — Encounter (INDEPENDENT_AMBULATORY_CARE_PROVIDER_SITE_OTHER): Payer: Self-pay | Admitting: Cardiology

## 2022-09-25 DIAGNOSIS — G4733 Obstructive sleep apnea (adult) (pediatric): Secondary | ICD-10-CM

## 2022-09-29 ENCOUNTER — Ambulatory Visit: Payer: BC Managed Care – PPO | Attending: Cardiovascular Disease

## 2022-09-29 DIAGNOSIS — R0683 Snoring: Secondary | ICD-10-CM

## 2022-09-29 NOTE — Procedures (Signed)
SLEEP STUDY REPORT Patient Information Study Date: 09/25/2022 Patient Name: Kayla Jacobson Patient ID: [id number] Birth Date: 1975-06-30 Age: 47 Gender: Female BMI: 43.3 (W=275 lb, H=5' 7'')  Referring Physician: Thurmon Fair, MD  TEST DESCRIPTION: Home sleep apnea testing was completed using the WatchPat, a Type 1 device, utilizing  peripheral arterial tonometry (PAT), chest movement, actigraphy, pulse oximetry, pulse rate, body position and snore.  AHI was calculated with apnea and hypopnea using valid sleep time as the denominator. RDI includes apneas,  hypopneas, and RERAs. The data acquired and the scoring of sleep and all associated events were performed in  accordance with the recommended standards and specifications as outlined in the AASM Manual for the Scoring of  Sleep and Associated Events 2.2.0 (2015).   FINDINGS:   1. Mild Obstructive Sleep Apnea with AHI 8.4/hr overall but moderate during REM sleep with REM AHI 24.5/hr..   2. No Central Sleep Apnea with pAHIc 0/hr.   3. Oxygen desaturations as low as 78%.   4. Moderate snoring was present. O2 sats were < 88% for 4.7 min.   5. Total sleep time was 7 hrs and 39 min.   6. 28.8% of total sleep time was spent in REM sleep.   7. Normal sleep onset latency at 17 min.   8. Shortened REM sleep onset latency at 74 min.   9. Total awakenings were 7.  10. Arrhythmia detection: None  DIAGNOSIS: Mild Obstructive Sleep Apnea (G47.33)  RECOMMENDATIONS: 1. Clinical correlation of these findings is necessary. The decision to treat obstructive sleep apnea (OSA) is usually  based on the presence of apnea symptoms or the presence of associated medical conditions such as Hypertension,  Congestive Heart Failure, Atrial Fibrillation or Obesity. The most common symptoms of OSA are snoring, gasping for  breath while sleeping, daytime sleepiness and fatigue.   2. Initiating apnea therapy is recommended given the presence of  symptoms and/or associated conditions.  Recommend proceeding with one of the following:   a. Auto-CPAP therapy with a pressure range of 5-20cm H2O.   b. An oral appliance (OA) that can be obtained from certain dentists with expertise in sleep medicine. These are  primarily of use in non-obese patients with mild and moderate disease.   c. An ENT consultation which may be useful to look for specific causes of obstruction and possible treatment  options.  d. If patient is intolerant to PAP therapy, consider referral to ENT for evaluation for hypoglossal nerve stimulator.   3. Close follow-up is necessary to ensure success with CPAP or oral appliance therapy for maximum benefit .  4. A follow-up oximetry study on CPAP is recommended to assess the adequacy of therapy and determine the need  for supplemental oxygen or the potential need for Bi-level therapy. An arterial blood gas to determine the adequacy of  baseline ventilation and oxygenation should also be considered.  5. Healthy sleep recommendations include: adequate nightly sleep (normal 7-9 hrs/night), avoidance of caffeine after  noon and alcohol near bedtime, and maintaining a sleep environment that is cool, dark and quiet.  6. Weight loss for overweight patients is recommended. Even modest amounts of weight loss can significantly  improve the severity of sleep apnea.  7. Snoring recommendations include: weight loss where appropriate, side sleeping, and avoidance of alcohol before  bed.  8. Operation of motor vehicle should be avoided when sleepy.  Signature: Armanda Magic, MD; [facility]; Diplomat, American Board of Sleep  Medicine Electronically Signed: 09/29/2022  5:32:49 PM

## 2022-09-30 ENCOUNTER — Encounter: Payer: Self-pay | Admitting: Cardiovascular Disease

## 2022-10-06 ENCOUNTER — Telehealth: Payer: Self-pay | Admitting: *Deleted

## 2022-10-06 DIAGNOSIS — G4710 Hypersomnia, unspecified: Secondary | ICD-10-CM

## 2022-10-06 DIAGNOSIS — R0683 Snoring: Secondary | ICD-10-CM

## 2022-10-06 DIAGNOSIS — G4733 Obstructive sleep apnea (adult) (pediatric): Secondary | ICD-10-CM

## 2022-10-06 NOTE — Telephone Encounter (Signed)
The patient has been notified of the result. Left detailed message on voicemail and informed patient to call back..Syniyah Bourne Green, CMA   

## 2022-10-06 NOTE — Telephone Encounter (Signed)
-----   Message from Armanda Magic sent at 09/29/2022  5:34 PM EDT ----- Please let patient know that they have sleep apnea and recommend treating with CPAP.  Please order an auto CPAP from 4-15cm H2O with heated humidity and mask of choice.  Order overnight pulse ox on CPAP.  Followup with me in 6 weeks.

## 2022-10-07 ENCOUNTER — Encounter: Payer: Self-pay | Admitting: Cardiovascular Disease

## 2022-10-07 NOTE — Telephone Encounter (Signed)
Recurrence of paroxysmal atrial fibrillation in the setting of an upper respiratory tract infection and use of decongestants.  Currently only mildly symptomatic, without dyspnea/angina/dizziness or syncope.  Ventricular rate appears to be well-controlled on current medications.  Okay to take additional doses of the metoprolol as needed to keep heart rate less than 100.  The arrhythmia is likely to subside as the viral infection improves.  Try to avoid use of pseudoephedrine.  Would be helpful to have a self monitoring device such as a smart watch or Kardia.  Please send transmissions from the device via MyChart if needed.

## 2022-10-16 NOTE — Telephone Encounter (Signed)
The patient has been notified of the result and verbalized understanding.  All questions (if any) were answered. Latrelle Dodrill, CMA 10/16/2022 5:37 PM    Upon patient request DME selection is [facility], [facility]. Patient understands he will be contacted by [facility] to set up his cpap. Patient understands to call if [facility] does not contact him with new setup in a timely manner. Patient understands they will be called once confirmation has been received from [facility]/ that they have received their new machine to schedule 10 week follow up appointment.   [facility] notified of new cpap order  Please add to airview Patient was grateful for the call and thanked me.

## 2022-10-29 ENCOUNTER — Encounter: Payer: Self-pay | Admitting: Cardiovascular Disease

## 2022-11-13 DIAGNOSIS — G4733 Obstructive sleep apnea (adult) (pediatric): Secondary | ICD-10-CM | POA: Diagnosis not present

## 2022-12-13 DIAGNOSIS — G4733 Obstructive sleep apnea (adult) (pediatric): Secondary | ICD-10-CM | POA: Diagnosis not present

## 2023-01-08 ENCOUNTER — Other Ambulatory Visit: Payer: Self-pay

## 2023-01-13 DIAGNOSIS — G4733 Obstructive sleep apnea (adult) (pediatric): Secondary | ICD-10-CM | POA: Diagnosis not present

## 2023-02-13 DIAGNOSIS — G4733 Obstructive sleep apnea (adult) (pediatric): Secondary | ICD-10-CM | POA: Diagnosis not present

## 2023-03-13 DIAGNOSIS — G4733 Obstructive sleep apnea (adult) (pediatric): Secondary | ICD-10-CM | POA: Diagnosis not present
